# Patient Record
Sex: Female | Born: 1975 | Race: White | Hispanic: No | Marital: Married | Smoking: Former smoker
Health system: Southern US, Community
[De-identification: ages and names within clinical notes are randomized; demographics above are authoritative.]

## PROBLEM LIST (undated history)

## (undated) DIAGNOSIS — Z9289 Personal history of other medical treatment: Secondary | ICD-10-CM

## (undated) DIAGNOSIS — Z98891 History of uterine scar from previous surgery: Secondary | ICD-10-CM

## (undated) DIAGNOSIS — K219 Gastro-esophageal reflux disease without esophagitis: Secondary | ICD-10-CM

## (undated) DIAGNOSIS — R011 Cardiac murmur, unspecified: Secondary | ICD-10-CM

## (undated) DIAGNOSIS — N939 Abnormal uterine and vaginal bleeding, unspecified: Secondary | ICD-10-CM

## (undated) DIAGNOSIS — E039 Hypothyroidism, unspecified: Secondary | ICD-10-CM

## (undated) DIAGNOSIS — O24419 Gestational diabetes mellitus in pregnancy, unspecified control: Secondary | ICD-10-CM

## (undated) DIAGNOSIS — E559 Vitamin D deficiency, unspecified: Secondary | ICD-10-CM

## (undated) DIAGNOSIS — L219 Seborrheic dermatitis, unspecified: Secondary | ICD-10-CM

## (undated) DIAGNOSIS — H4020X0 Unspecified primary angle-closure glaucoma, stage unspecified: Secondary | ICD-10-CM

## (undated) DIAGNOSIS — Z9889 Other specified postprocedural states: Secondary | ICD-10-CM

## (undated) DIAGNOSIS — I8393 Asymptomatic varicose veins of bilateral lower extremities: Secondary | ICD-10-CM

## (undated) DIAGNOSIS — H40002 Preglaucoma, unspecified, left eye: Secondary | ICD-10-CM

## (undated) DIAGNOSIS — E119 Type 2 diabetes mellitus without complications: Secondary | ICD-10-CM

## (undated) DIAGNOSIS — M2550 Pain in unspecified joint: Secondary | ICD-10-CM

## (undated) HISTORY — PX: DIAGNOSTIC LAPAROSCOPY: SUR761

---

## 2015-10-23 LAB — OB RESULTS CONSOLE RUBELLA ANTIBODY, IGM: RUBELLA: IMMUNE

## 2015-10-23 LAB — OB RESULTS CONSOLE HEPATITIS B SURFACE ANTIGEN: Hepatitis B Surface Ag: NEGATIVE

## 2015-10-23 LAB — OB RESULTS CONSOLE VARICELLA ZOSTER ANTIBODY, IGG: VARICELLA IGG: IMMUNE

## 2016-05-12 LAB — OB RESULTS CONSOLE GC/CHLAMYDIA
Chlamydia: NEGATIVE
Gonorrhea: NEGATIVE

## 2016-05-12 LAB — OB RESULTS CONSOLE GBS: STREP GROUP B AG: POSITIVE

## 2016-05-12 LAB — OB RESULTS CONSOLE HIV ANTIBODY (ROUTINE TESTING): HIV: NONREACTIVE

## 2016-05-27 NOTE — H&P (Signed)
Tonya Woodward is a 40 y.o. 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 No past surgical history on file. Family History: family history is not on file. Social History:  has no tobacco, alcohol, and drug history on file.     Maternal Diabetes: Yes:  Diabetes Type:  Insulin/Medication controlled Genetic Screening: Normal Maternal Ultrasounds/Referrals: Normal Fetal Ultrasounds or other Referrals:  None Maternal Substance Abuse:  No Significant Maternal Medications:  None Significant Maternal Lab Results:  None Other Comments:  None  ROS History   Wt 197  BP 102/78 p=104 Exam Physical Exam  LUngs CTA  CV RRR  abd soft NT , gravid  Prenatal labs: ABO, Rh:  O+ Antibody:  screen neg Rubella:  imm RPR:   NR HBsAg:   neg HIV:   Neg GBS:   +  Assessment/Plan: Breech  Primary LTCS on 06/07/16 Risks discussed with pt and consent signed    Tayvon Culley 05/27/2016, 9:08 PM

## 2016-06-06 ENCOUNTER — Encounter: Payer: Self-pay | Admitting: *Deleted

## 2016-06-06 ENCOUNTER — Encounter
Admission: RE | Admit: 2016-06-06 | Discharge: 2016-06-06 | Disposition: A | Discharge status: Home / Self Care | Payer: 59 | Source: Ambulatory Visit | Attending: Obstetrics and Gynecology | Admitting: Obstetrics and Gynecology

## 2016-06-06 HISTORY — DX: Gastro-esophageal reflux disease without esophagitis: K21.9

## 2016-06-06 HISTORY — DX: Cardiac murmur, unspecified: R01.1

## 2016-06-06 HISTORY — DX: Type 2 diabetes mellitus without complications: E11.9

## 2016-06-06 HISTORY — DX: Hypothyroidism, unspecified: E03.9

## 2016-06-06 LAB — CBC
HCT: 35.2 % (ref 35.0–47.0)
HEMOGLOBIN: 12.2 g/dL (ref 12.0–16.0)
MCH: 28.5 pg (ref 26.0–34.0)
MCHC: 34.6 g/dL (ref 32.0–36.0)
MCV: 82.3 fL (ref 80.0–100.0)
Platelets: 168 10*3/uL (ref 150–440)
RBC: 4.28 MIL/uL (ref 3.80–5.20)
RDW: 13.8 % (ref 11.5–14.5)
WBC: 8.2 10*3/uL (ref 3.6–11.0)

## 2016-06-06 LAB — TYPE AND SCREEN
ABO/RH(D): O POS
Antibody Screen: NEGATIVE
EXTEND SAMPLE REASON: UNDETERMINED

## 2016-06-06 LAB — RAPID HIV SCREEN (HIV 1/2 AB+AG)
HIV 1/2 ANTIBODIES: NONREACTIVE
HIV-1 P24 ANTIGEN - HIV24: NONREACTIVE

## 2016-06-06 NOTE — Patient Instructions (Signed)
  Your procedure is scheduled on:June 07, 2016 (Tuesday) Report to LABOR AND DELIVERY To find out your arrival time please call [phone] between 1PM - 3PM on  ARRIVAL TIME 7:30 AM  Remember: Instructions that are not followed completely may result in serious medical risk, up to and including death, or upon the discretion of your surgeon and anesthesiologist your surgery may need to be rescheduled.    _x___ 1. Do not eat food or drink liquids after midnight. No gum chewing or hard candies.     _x__ 2. No Alcohol for 24 hours before or after surgery.   _x__3. No Smoking for 24 prior to surgery.   ____  4. Bring all medications with you on the day of surgery if instructed.    _x___ 5. Notify your doctor if there is any change in your medical condition     (cold, fever, infections).     Do not wear jewelry, make-up, hairpins, clips or nail polish.  Do not wear lotions, powders, or perfumes. You may wear deodorant.  Do not shave 48 hours prior to surgery. Men may shave face and neck.  Do not bring valuables to the hospital.    [facility] is not responsible for any belongings or valuables.               Contacts, dentures or bridgework may not be worn into surgery.  Leave your suitcase in the car. After surgery it may be brought to your room.  For patients admitted to the hospital, discharge time is determined by your treatment team.   Patients discharged the day of surgery will not be allowed to drive home.    Please read over the following fact sheets that you were given:   [facility] Preparing for Surgery and or MRSA Information   _x___ Take these medicines the morning of surgery with A SIP OF WATER:    1. LEVOTHYROXINE  2.  3.  4.  5.  6.  ____ Fleet Enema (as directed)   _x___ Use CHG Soap or sage wipes as directed on instruction sheet   ____ Use inhalers on the day of surgery and bring to hospital day of surgery  ____ Stop metformin 2 days prior to  surgery    ____ Take 1/2 of usual insulin dose the night before surgery and none on the morning of surgery           __x__ Stop aspirin or coumadin, or plavix (NO ASPIRIN)  _x__ Stop Anti-inflammatories such as Advil, Aleve, Ibuprofen, Motrin, Naproxen,          Naprosyn, Goodies powders or aspirin products. Ok to take Tylenol.   ____ Stop supplements until after surgery.    ____ Bring C-Pap to the hospital.

## 2016-06-07 ENCOUNTER — Encounter
Admission: RE | Disposition: A | Discharge status: Home / Self Care | Payer: Self-pay | Source: Ambulatory Visit | Attending: Obstetrics and Gynecology

## 2016-06-07 ENCOUNTER — Encounter: Payer: Self-pay | Admitting: *Deleted

## 2016-06-07 ENCOUNTER — Inpatient Hospital Stay: Payer: 59 | Admitting: Anesthesiology

## 2016-06-07 ENCOUNTER — Inpatient Hospital Stay
Admission: RE | Admit: 2016-06-07 | Discharge: 2016-06-09 | DRG: 766 | Disposition: A | Discharge status: Home / Self Care | Payer: 59 | Source: Ambulatory Visit | Attending: Obstetrics and Gynecology | Admitting: Obstetrics and Gynecology

## 2016-06-07 DIAGNOSIS — Z6831 Body mass index (BMI) 31.0-31.9, adult: Secondary | ICD-10-CM

## 2016-06-07 DIAGNOSIS — O24429 Gestational diabetes mellitus in childbirth, unspecified control: Secondary | ICD-10-CM | POA: Diagnosis present

## 2016-06-07 DIAGNOSIS — O321XX0 Maternal care for breech presentation, not applicable or unspecified: Secondary | ICD-10-CM | POA: Diagnosis present

## 2016-06-07 DIAGNOSIS — O24425 Gestational diabetes mellitus in childbirth, controlled by oral hypoglycemic drugs: Secondary | ICD-10-CM | POA: Diagnosis present

## 2016-06-07 DIAGNOSIS — Z9889 Other specified postprocedural states: Secondary | ICD-10-CM

## 2016-06-07 DIAGNOSIS — O99214 Obesity complicating childbirth: Secondary | ICD-10-CM | POA: Diagnosis present

## 2016-06-07 LAB — RPR: RPR: NONREACTIVE

## 2016-06-07 LAB — GLUCOSE, CAPILLARY
GLUCOSE-CAPILLARY: 122 mg/dL — AB (ref 65–99)
Glucose-Capillary: 73 mg/dL (ref 65–99)

## 2016-06-07 SURGERY — Surgical Case
Anesthesia: Spinal

## 2016-06-07 MED ORDER — IBUPROFEN 600 MG PO TABS: 600.0000 mg | ORAL_TABLET | Freq: Four times a day (QID) | ORAL | Status: DC | PRN

## 2016-06-07 MED ORDER — SENNOSIDES-DOCUSATE SODIUM 8.6-50 MG PO TABS
2.0000 | ORAL_TABLET | ORAL | Status: DC
  Administered 2016-06-08 (×2): 2 via ORAL
  Filled 2016-06-07 (×2): qty 2

## 2016-06-07 MED ORDER — DIPHENHYDRAMINE HCL 25 MG PO CAPS: 25.0000 mg | ORAL_CAPSULE | Freq: Four times a day (QID) | ORAL | Status: DC | PRN

## 2016-06-07 MED ORDER — BUPIVACAINE HCL (PF) 0.5 % IJ SOLN
INTRAMUSCULAR | Status: AC
  Filled 2016-06-07: qty 30

## 2016-06-07 MED ORDER — BUPIVACAINE 0.25 % ON-Q PUMP DUAL CATH 400 ML: 400.0000 mL | INJECTION | Status: DC

## 2016-06-07 MED ORDER — MORPHINE SULFATE (PF) 2 MG/ML IV SOLN: 1.0000 mg | INTRAVENOUS | Status: DC | PRN

## 2016-06-07 MED ORDER — NALBUPHINE HCL 10 MG/ML IJ SOLN: 5.0000 mg | Freq: Once | INTRAMUSCULAR | Status: DC | PRN

## 2016-06-07 MED ORDER — ONDANSETRON HCL 4 MG/2ML IJ SOLN: 4.0000 mg | Freq: Once | INTRAMUSCULAR | Status: DC | PRN

## 2016-06-07 MED ORDER — CEFAZOLIN SODIUM-DEXTROSE 2-4 GM/100ML-% IV SOLN
2.0000 g | INTRAVENOUS | Status: AC
  Administered 2016-06-07: 2 g via INTRAVENOUS
  Filled 2016-06-07: qty 100

## 2016-06-07 MED ORDER — ZOLPIDEM TARTRATE 5 MG PO TABS: 5.0000 mg | ORAL_TABLET | Freq: Every evening | ORAL | Status: DC | PRN

## 2016-06-07 MED ORDER — OXYCODONE HCL 5 MG PO TABS
10.0000 mg | ORAL_TABLET | ORAL | Status: DC | PRN
  Administered 2016-06-08 – 2016-06-09 (×6): 10 mg via ORAL
  Filled 2016-06-07 (×6): qty 2

## 2016-06-07 MED ORDER — NALOXONE HCL 0.4 MG/ML IJ SOLN: 0.4000 mg | INTRAMUSCULAR | Status: DC | PRN

## 2016-06-07 MED ORDER — DIPHENHYDRAMINE HCL 50 MG/ML IJ SOLN: 12.5000 mg | INTRAMUSCULAR | Status: DC | PRN

## 2016-06-07 MED ORDER — BUPIVACAINE 0.25 % ON-Q PUMP DUAL CATH 400 ML
INJECTION | Status: AC
  Filled 2016-06-07: qty 400

## 2016-06-07 MED ORDER — ONDANSETRON HCL 4 MG/2ML IJ SOLN: 4.0000 mg | Freq: Three times a day (TID) | INTRAMUSCULAR | Status: DC | PRN

## 2016-06-07 MED ORDER — SIMETHICONE 80 MG PO CHEW
80.0000 mg | CHEWABLE_TABLET | ORAL | Status: DC
  Administered 2016-06-08: 80 mg via ORAL
  Filled 2016-06-07: qty 1

## 2016-06-07 MED ORDER — SODIUM CHLORIDE 0.9% FLUSH: 3.0000 mL | INTRAVENOUS | Status: DC | PRN

## 2016-06-07 MED ORDER — DIPHENHYDRAMINE HCL 25 MG PO CAPS: 25.0000 mg | ORAL_CAPSULE | ORAL | Status: DC | PRN

## 2016-06-07 MED ORDER — FENTANYL CITRATE (PF) 100 MCG/2ML IJ SOLN: 25.0000 ug | INTRAMUSCULAR | Status: DC | PRN

## 2016-06-07 MED ORDER — COCONUT OIL OIL
1.0000 "application " | TOPICAL_OIL | Status: DC | PRN
  Filled 2016-06-07: qty 120

## 2016-06-07 MED ORDER — SCOPOLAMINE 1 MG/3DAYS TD PT72: 1.0000 | MEDICATED_PATCH | Freq: Once | TRANSDERMAL | Status: DC

## 2016-06-07 MED ORDER — DIBUCAINE 1 % RE OINT
1.0000 | TOPICAL_OINTMENT | RECTAL | Status: DC | PRN
Start: 2016-06-07 — End: 2016-06-09

## 2016-06-07 MED ORDER — WITCH HAZEL-GLYCERIN EX PADS
1.0000 | MEDICATED_PAD | CUTANEOUS | Status: DC | PRN
Start: 2016-06-07 — End: 2016-06-09

## 2016-06-07 MED ORDER — ONDANSETRON HCL 4 MG/2ML IJ SOLN
INTRAMUSCULAR | Status: DC | PRN
  Administered 2016-06-07: 4 mg via INTRAVENOUS

## 2016-06-07 MED ORDER — LACTATED RINGERS IV SOLN
Freq: Once | INTRAVENOUS | Status: AC
  Administered 2016-06-07: 09:00:00 via INTRAVENOUS

## 2016-06-07 MED ORDER — BUPIVACAINE-EPINEPHRINE (PF) 0.5% -1:200000 IJ SOLN
INTRAMUSCULAR | Status: DC | PRN
  Administered 2016-06-07: 10 mL

## 2016-06-07 MED ORDER — OXYTOCIN 40 UNITS IN LACTATED RINGERS INFUSION - SIMPLE MED
INTRAVENOUS | Status: DC | PRN
  Administered 2016-06-07: 200 mL via INTRAVENOUS
  Administered 2016-06-07: 800 mL via INTRAVENOUS

## 2016-06-07 MED ORDER — SIMETHICONE 80 MG PO CHEW: 80.0000 mg | CHEWABLE_TABLET | ORAL | Status: DC | PRN

## 2016-06-07 MED ORDER — LACTATED RINGERS IV SOLN
INTRAVENOUS | Status: DC
  Administered 2016-06-08: 03:00:00 via INTRAVENOUS

## 2016-06-07 MED ORDER — MEPERIDINE HCL 25 MG/ML IJ SOLN: 6.2500 mg | INTRAMUSCULAR | Status: DC | PRN

## 2016-06-07 MED ORDER — IBUPROFEN 600 MG PO TABS
600.0000 mg | ORAL_TABLET | Freq: Four times a day (QID) | ORAL | Status: DC
  Administered 2016-06-07 – 2016-06-09 (×8): 600 mg via ORAL
  Filled 2016-06-07 (×8): qty 1

## 2016-06-07 MED ORDER — MENTHOL 3 MG MT LOZG
1.0000 | LOZENGE | OROMUCOSAL | Status: DC | PRN
  Filled 2016-06-07: qty 9

## 2016-06-07 MED ORDER — TETANUS-DIPHTH-ACELL PERTUSSIS 5-2.5-18.5 LF-MCG/0.5 IM SUSP: 0.5000 mL | Freq: Once | INTRAMUSCULAR | Status: DC

## 2016-06-07 MED ORDER — NALBUPHINE HCL 10 MG/ML IJ SOLN: 5.0000 mg | INTRAMUSCULAR | Status: DC | PRN

## 2016-06-07 MED ORDER — NALOXONE HCL 2 MG/2ML IJ SOSY
1.0000 ug/kg/h | PREFILLED_SYRINGE | INTRAMUSCULAR | Status: DC | PRN
  Filled 2016-06-07: qty 2

## 2016-06-07 MED ORDER — LACTATED RINGERS IV SOLN
INTRAVENOUS | Status: DC
  Administered 2016-06-07: 09:00:00 via INTRAVENOUS

## 2016-06-07 MED ORDER — SIMETHICONE 80 MG PO CHEW
80.0000 mg | CHEWABLE_TABLET | Freq: Three times a day (TID) | ORAL | Status: DC
  Administered 2016-06-07 – 2016-06-09 (×5): 80 mg via ORAL
  Filled 2016-06-07 (×5): qty 1

## 2016-06-07 MED ORDER — OXYTOCIN 40 UNITS IN LACTATED RINGERS INFUSION - SIMPLE MED
2.5000 [IU]/h | INTRAVENOUS | Status: DC
Start: 2016-06-07 — End: 2016-06-08

## 2016-06-07 MED ORDER — OXYTOCIN 40 UNITS IN LACTATED RINGERS INFUSION - SIMPLE MED: INTRAVENOUS | Status: DC | PRN

## 2016-06-07 MED ORDER — LEVOTHYROXINE SODIUM 112 MCG PO TABS
112.0000 ug | ORAL_TABLET | Freq: Every day | ORAL | Status: DC
  Administered 2016-06-08: 112 ug via ORAL
  Filled 2016-06-07: qty 1

## 2016-06-07 MED ORDER — SOD CITRATE-CITRIC ACID 500-334 MG/5ML PO SOLN
30.0000 mL | ORAL | Status: AC
  Administered 2016-06-07: 30 mL via ORAL
  Filled 2016-06-07: qty 30

## 2016-06-07 MED ORDER — PANTOPRAZOLE SODIUM 40 MG PO TBEC
40.0000 mg | DELAYED_RELEASE_TABLET | Freq: Every day | ORAL | Status: DC | PRN
  Filled 2016-06-07: qty 1

## 2016-06-07 MED ORDER — OXYTOCIN 40 UNITS IN LACTATED RINGERS INFUSION - SIMPLE MED
INTRAVENOUS | Status: AC
  Administered 2016-06-07: 40 [IU]
  Filled 2016-06-07: qty 1000

## 2016-06-07 MED ORDER — ACETAMINOPHEN 325 MG PO TABS: 650.0000 mg | ORAL_TABLET | ORAL | Status: DC | PRN

## 2016-06-07 MED ORDER — PHENYLEPHRINE HCL 10 MG/ML IJ SOLN
INTRAMUSCULAR | Status: DC | PRN
  Administered 2016-06-07 (×2): 100 ug via INTRAVENOUS
  Administered 2016-06-07 (×2): 200 ug via INTRAVENOUS
  Administered 2016-06-07 (×3): 100 ug via INTRAVENOUS

## 2016-06-07 MED ORDER — PRENATAL MULTIVITAMIN CH
1.0000 | ORAL_TABLET | Freq: Every day | ORAL | Status: DC
  Administered 2016-06-07 – 2016-06-09 (×3): 1 via ORAL
  Filled 2016-06-07 (×4): qty 1

## 2016-06-07 SURGICAL SUPPLY — 24 items
BARRIER ADHS 3X4 INTERCEED (GAUZE/BANDAGES/DRESSINGS) ×3 IMPLANT
CANISTER SUCT 3000ML (MISCELLANEOUS) ×3 IMPLANT
CATH KIT ON-Q SILVERSOAK 5IN (CATHETERS) ×6 IMPLANT
CHLORAPREP W/TINT 26ML (MISCELLANEOUS) ×3 IMPLANT
DRSG TELFA 3X8 NADH (GAUZE/BANDAGES/DRESSINGS) ×3 IMPLANT
ELECT CAUTERY BLADE 6.4 (BLADE) ×3 IMPLANT
ELECT REM PT RETURN 9FT ADLT (ELECTROSURGICAL) ×3
ELECTRODE REM PT RTRN 9FT ADLT (ELECTROSURGICAL) ×1 IMPLANT
GAUZE SPONGE 4X4 12PLY STRL (GAUZE/BANDAGES/DRESSINGS) ×3 IMPLANT
GLOVE BIO SURGEON STRL SZ8 (GLOVE) ×3 IMPLANT
GOWN STRL REUS W/ TWL LRG LVL3 (GOWN DISPOSABLE) ×2 IMPLANT
GOWN STRL REUS W/ TWL XL LVL3 (GOWN DISPOSABLE) ×1 IMPLANT
GOWN STRL REUS W/TWL LRG LVL3 (GOWN DISPOSABLE) ×4
GOWN STRL REUS W/TWL XL LVL3 (GOWN DISPOSABLE) ×2
NS IRRIG 1000ML POUR BTL (IV SOLUTION) ×6 IMPLANT
PACK C SECTION AR (MISCELLANEOUS) ×3 IMPLANT
PAD OB MATERNITY 4.3X12.25 (PERSONAL CARE ITEMS) ×3 IMPLANT
PAD PREP 24X41 OB/GYN DISP (PERSONAL CARE ITEMS) ×3 IMPLANT
SPONGE LAP 4X18 5PK (MISCELLANEOUS) ×3 IMPLANT
STAPLER INSORB 30 2030 C-SECTI (MISCELLANEOUS) ×3 IMPLANT
STRAP SAFETY BODY (MISCELLANEOUS) ×3 IMPLANT
SUT CHROMIC 1 CTX 36 (SUTURE) ×9 IMPLANT
SUT PLAIN GUT 0 (SUTURE) ×6 IMPLANT
SUT VIC AB 0 CT1 36 (SUTURE) ×6 IMPLANT

## 2016-06-07 NOTE — Transfer of Care (Signed)
Immediate Anesthesia Transfer of Care Note  Patient: Tonya BearsMartha R Hightower  Procedure(s) Performed: Procedure(s): CESAREAN SECTION (N/A)  Patient Location: PACU  Anesthesia Type:Spinal  Level of Consciousness: awake  Airway & Oxygen Therapy: Patient Spontanous Breathing  Post-op Assessment: Report given to RN and Post -op Vital signs reviewed and stable  Post vital signs: Reviewed and stable  Last Vitals:  Vitals:   06/07/16 0840 06/07/16 1002  BP:  127/75  Pulse:  83  Resp: 16   Temp: 37.1 C     Last Pain:  Vitals:   06/07/16 0840  TempSrc: Oral  PainSc:          Complications: No apparent anesthesia complications

## 2016-06-07 NOTE — Progress Notes (Signed)
Pt's capillary glucose is 122 at this time; pt "just had my dinner 2 hours ago"; this glucose would be considered a 2-hour post prandial glucose (the MD order reads check CBG every 8 hours for the first 24 hours)

## 2016-06-07 NOTE — Progress Notes (Signed)
Ready for primary LTCS for breech presentation . Verified again today . All questions answered . NPO . Declines BTL  On q pump requested .

## 2016-06-07 NOTE — Brief Op Note (Signed)
06/07/2016  11:09 AM  Delivery 1038 on 06/07/16  PATIENT:  Tonya Woodward  40 y.o. female  PRE-OPERATIVE DIAGNOSIS:  Breech presentation  POST-OPERATIVE DIAGNOSIS:  Breech Homero FellersFrank PROCEDURE:  Ow transverse cesarean section On q pump placement SURGEON:  Surgeon(s) and Role:    Suzy Bouchard* Thomas J Schermerhorn, MD - Primary  PHYSICIAN ASSISTANT: Sigmon ,M   CNM  ASSISTANTS: none   ANESTHESIA:   spinal  EBL:  Total I/O In: 800 [I.V.:800] Out: 500 [Blood:500]  BLOOD ADMINISTERED:none  DRAINS: Urinary Catheter (Foley)   LOCAL MEDICATIONS USED:  MARCAINE     SPECIMEN:  No Specimen  DISPOSITION OF SPECIMEN:  N/A  COUNTS:  YES  TOURNIQUET:  * No tourniquets in log *  DICTATION: .Other Dictation: Dictation Number verbal  PLAN OF CARE: Admit to inpatient   PATIENT DISPOSITION:  PACU - hemodynamically stable.   Delay start of Pharmacological VTE agent (>24hrs) due to surgical blood loss or risk of bleeding: not applicable

## 2016-06-07 NOTE — Anesthesia Procedure Notes (Signed)
Spinal  Patient location during procedure: OB Start time: 06/07/2016 10:17 AM End time: 06/07/2016 10:22 AM Staffing Anesthesiologist: Yves DillARROLL, Mikyla Schachter Performed: anesthesiologist  Preanesthetic Checklist Completed: patient identified, site marked, surgical consent, pre-op evaluation, timeout performed, IV checked, risks and benefits discussed and monitors and equipment checked Spinal Block Patient position: sitting Prep: Betadine and site prepped and draped Patient monitoring: heart rate, cardiac monitor, continuous pulse ox and blood pressure Approach: right paramedian Location: L3-4 Injection technique: single-shot Needle Needle type: Whitacre  Needle gauge: 25 G Needle length: 9 cm Assessment Sensory level: T4 Additional Notes Time out called.  Patient placed in sitting position.  Back prepped and draped in sterile fashion.  A skin wheal was made lateral to the L3-L4 interspace and a paramedian approach was used after unsuucessful pass  In the median position after adequate skin wheal with 1% Lidocaine plain.  Easy X1 in the paramedian position with the return of clear, colorless CSF in all 4 quads.  Level obtained was T4T4 by cold and pin prick.  13 mg of spinal Marcaine + 0.2 mg of Astromorph.  No blood or paresthesias and patient tolerated the procedure well.

## 2016-06-07 NOTE — Anesthesia Preprocedure Evaluation (Signed)
Anesthesia Evaluation  Patient identified by MRN, date of birth, ID band Patient awake    Reviewed: Allergy & Precautions, NPO status , Patient's Chart, lab work & pertinent test results  Airway Mallampati: II       Dental no notable dental hx.    Pulmonary neg pulmonary ROS, former smoker,    Pulmonary exam normal        Cardiovascular negative cardio ROS Normal cardiovascular exam+ Valvular Problems/Murmurs      Neuro/Psych negative neurological ROS  negative psych ROS   GI/Hepatic GERD  Medicated and Controlled,  Endo/Other  diabetes, Well Controlled, Type 2, Oral Hypoglycemic AgentsHypothyroidism   Renal/GU   negative genitourinary   Musculoskeletal negative musculoskeletal ROS (+)   Abdominal Normal abdominal exam  (+)   Peds negative pediatric ROS (+)  Hematology negative hematology ROS (+)   Anesthesia Other Findings   Reproductive/Obstetrics                             Anesthesia Physical Anesthesia Plan  ASA: II  Anesthesia Plan: Spinal   Post-op Pain Management:    Induction: Intravenous  Airway Management Planned: Nasal Cannula  Additional Equipment:   Intra-op Plan:   Post-operative Plan:   Informed Consent: I have reviewed the patients History and Physical, chart, labs and discussed the procedure including the risks, benefits and alternatives for the proposed anesthesia with the patient or authorized representative who has indicated his/her understanding and acceptance.   Dental advisory given  Plan Discussed with: CRNA and Surgeon  Anesthesia Plan Comments:         Anesthesia Quick Evaluation

## 2016-06-07 NOTE — Op Note (Signed)
NAME:  Tonya Woodward, Tonya Woodward            ACCOUNT NO.:  [id number]  MEDICAL RECORD NO.:  [id number]  LOCATION:  LDR6                         FACILITY:  [facility]  PHYSICIAN:  Jennell Cornerhomas Shaquavia Whisonant, MDDATE OF BIRTH:  04-16-1976  DATE OF PROCEDURE: DATE OF DISCHARGE:                              OPERATIVE REPORT   PREOPERATIVE DIAGNOSIS: 1. 39+ 6 weeks estimated gestational age. 2. Breech presentation.  POSTOPERATIVE DIAGNOSIS: 1. 39+ 6 weeks estimated gestational age. 2. Homero FellersFrank breech presentation.  PROCEDURE: 1. Primary low transverse cesarean section. 2. Placement of ON-Q pump.  ANESTHESIA:  Spinal.  SURGEON:  Jennell Cornerhomas Merrill Deanda, MD.  FIRST ASSISTANT:  Sigmen, certified nurse midwife.  INDICATIONS:  A 40 year old female at 39+ 6 weeks estimated gestational age.  The patient is noted to have her fetus in the breech presentation and elects for primary low transverse cesarean section.  The patient declines bilateral tubal ligation.  DESCRIPTION OF PROCEDURE:  After adequate spinal anesthesia, the patient was placed in the dorsal supine position.  Hip rolled on to the right side.  The patient's abdomen was prepped and draped in normal sterile fashion.  Time-out was performed.  The patient did receive 2 g IV Ancef prior to commencement of the case.  A Pfannenstiel incision was made 2 fingerbreadths above the symphysis pubis.  Sharp dissection was used to identify the fascia.  Fascia was opened in midline and opened in a transverse fashion.  The superior aspect of the fascia was grasped with Kocher clamps and the recti muscles were dissected free.  Inferior aspect of the fascia was grasped with Kocher clamps and pyramidalis muscles dissected free.  Entry into the peritoneal cavity was accomplished sharply.  The vesicouterine peritoneal fold was identified and a bladder flap was created.  The bladder was reflected inferiorly. Low-transverse uterine incision was made upon entry into the  endometrial cavity.  Clear fluid resulted.  The incision was extended with blunt transverse traction.  Fetal buttocks were identified and delivered through the incision followed by the legs.  They were noted to be in a frank breech position.  Legs were delivered with a sweeping motion medial to lateral.  _wound towel_________ was placed over the buttocks to allow for traction as the arms were delivered also with the sweeping motion lateral to medial and head was delivered without difficulty.  Vigorous female was then dried for the next 60 seconds with a delayed cord clamping.  Cord was clamped and infant was passed to the neonatologist who assigned Apgar scores of 8 and 9.  Placenta was manually delivered. The uterus was exteriorized.  Endometrial cavity was wiped clean with laparotomy tape and the cervix was opened with a ring forceps.  Uterine incision was closed with a running 1 chromic suture.  Good hemostasis was noted.  Fallopian tubes and ovaries appeared normal.  Posterior cul- de-sac was irrigated and suctioned.  The uterus was then placed back into the abdominal cavity.  The pericolic gutters were wiped clean with laparotomy tip and uterine incision again appeared hemostatic. Interceed was placed over the uterine incision in a T-shaped fashion. The superior aspect of the fascia was grasped with Kocher clamps and the ON-Q pump catheters were advanced from infraumbilical  position to a subfascial position.  The fascia was then closed over top of these catheters with 0 Vicryl suture in a running nonlocking fashion.  Good approximation of the tissues noted.  Subcutaneous tissues were irrigated and bovied for hemostasis and the skin was reapproximated with INSORB absorbable staples.  Good cosmetic effect was noted.  Each catheter ON-Q catheter was then secured at the skin level with LiquiBand and Steri- Strips and Tegaderm was placed over top of these.  Each catheter was then loaded  with 5 mL of 0.5% Marcaine.  ESTIMATED BLOOD LOSS:  500 mL.  INTRAOPERATIVE FLUIDS:  1000 mL.  There were no complications.  The patient tolerated the procedure well.          ______________________________ Jennell Cornerhomas Pearse Shiffler, MD     TS/MEDQ  D:  06/07/2016  T:  06/07/2016  Job:  [id number]

## 2016-06-08 LAB — GLUCOSE, CAPILLARY
GLUCOSE-CAPILLARY: 114 mg/dL — AB (ref 65–99)
Glucose-Capillary: 99 mg/dL (ref 65–99)
Glucose-Capillary: 137 mg/dL — ABNORMAL HIGH (ref 65–99)

## 2016-06-08 LAB — CBC
HEMATOCRIT: 29.2 % — AB (ref 35.0–47.0)
Hemoglobin: 9.9 g/dL — ABNORMAL LOW (ref 12.0–16.0)
MCH: 28.1 pg (ref 26.0–34.0)
MCHC: 34 g/dL (ref 32.0–36.0)
MCV: 82.7 fL (ref 80.0–100.0)
Platelets: 146 10*3/uL — ABNORMAL LOW (ref 150–440)
RBC: 3.53 MIL/uL — ABNORMAL LOW (ref 3.80–5.20)
RDW: 13.6 % (ref 11.5–14.5)
WBC: 11.1 10*3/uL — ABNORMAL HIGH (ref 3.6–11.0)

## 2016-06-08 NOTE — Anesthesia Postprocedure Evaluation (Signed)
Anesthesia Post Note  Patient: Tonya BearsMartha R Woodward  Procedure(s) Performed: Procedure(s) (LRB): CESAREAN SECTION (N/A)  Patient location during evaluation: Mother Baby Anesthesia Type: Spinal Level of consciousness: awake and alert Pain management: pain level controlled Vital Signs Assessment: post-procedure vital signs reviewed and stable Respiratory status: spontaneous breathing Cardiovascular status: blood pressure returned to baseline Postop Assessment: no headache, no backache, spinal receding and patient able to bend at knees Anesthetic complications: no    Last Vitals:  Vitals:   06/08/16 0327 06/08/16 0722  BP: (!) 97/55 107/61  Pulse: 73 79  Resp: 18 20  Temp: 36.9 C 36.7 C    Last Pain:  Vitals:   06/08/16 0722  TempSrc: Oral  PainSc:                  Jules SchickLogan,  Evangelene Vora P

## 2016-06-08 NOTE — Lactation Note (Signed)
This note was copied from a baby's chart. Lactation Consultation Note  Patient Name: Tonya Woodward QIONG'EToday's Date: 06/08/2016 Reason for consult: Follow-up assessment;Breast/nipple pain;Difficult latch   Maternal Data  All day, Mom denied problems, pain or needing LC help, but now has bruised nipples (on face of each). Mom needed a little help to get nipple up deeper into shield chamber (20mm). The application of shield made mom yelp a bit from pain. Once baby started sucking, pain resolved. Mom does need to massage breast and stim baby to avoid her sleeping at breast. Baby does have a fairly tight frenulum I have been watching. If she can latch and transfer milk with out problems, she must be adapting well. If problems increase or persist, I would encourage more in depth look at frenulum as possible cause. I mentioned this to parents as well. Probably the most likely problem is that mom said she has had visitors all day and not focused on quality of feedings. Sh eis also quite exhausted. I encourged her to get a 1-2 hour nap before night time as babies tend to feed a lot at night. Otherwise , several voids and stools already.   Feeding Feeding Type: Breast Fed  LATCH Score/Interventions Latch: Repeated attempts needed to sustain latch, nipple held in mouth throughout feeding, stimulation needed to elicit sucking reflex. (needs nipple shield to maintain latch) Intervention(s): Assist with latch;Breast compression  Audible Swallowing: A few with stimulation Intervention(s): Hand expression  Type of Nipple: Everted at rest and after stimulation  Comfort (Breast/Nipple): Filling, red/small blisters or bruises, mild/mod discomfort  Problem noted: Cracked, bleeding, blisters, bruises (bruised nipples; pain with shield application) Interventions  (Cracked/bleeding/bruising/blister): Expressed breast milk to nipple (comfort gels. deeper latch)  Hold (Positioning): No assistance needed to  correctly position infant at breast.  LATCH Score: 7  Lactation Tools Discussed/Used     Consult Status Consult Status: Follow-up Date: 06/09/16 Follow-up type: In-patient    Sunday CornSandra Clark Lynard Postlewait 06/08/2016, 6:00 PM

## 2016-06-08 NOTE — Progress Notes (Signed)
Subjective: Postpartum Day 1: Cesarean Delivery Patient reports no problems voiding.    Objective: Vital signs in last 24 hours: Temp:  [96.2 F (35.7 C)-98.7 F (37.1 C)] 98.1 F (36.7 C) (08/16 0722) Pulse Rate:  [59-87] 79 (08/16 0722) Resp:  [16-20] 20 (08/16 0722) BP: (94-127)/(54-80) 107/61 (08/16 0722) SpO2:  [98 %-100 %] 98 % (08/16 0327) Weight:  [196 lb (88.9 kg)] 196 lb (88.9 kg) (08/15 1238)  Physical Exam:  General: alert and cooperative Lochia: appropriate Uterine Fundus: firm Incision: no significant drainage DVT Evaluation: No evidence of DVT seen on physical exam. cv RRR   Recent Labs  06/06/16 0904 06/08/16 0441  HGB 12.2 9.9*  HCT 35.2 29.2*    Assessment/Plan: Status post Cesarean section. Doing well postoperatively.  Continue current care.  Garrie Woodin 06/08/2016, 8:30 AM

## 2016-06-08 NOTE — Anesthesia Post-op Follow-up Note (Signed)
  Anesthesia Pain Follow-up Note  Patient: Tonya Woodward  Day #: 1  Date of Follow-up: 06/08/2016 Time: 7:26 AM  Last Vitals:  Vitals:   06/08/16 0327 06/08/16 0722  BP: (!) 97/55 107/61  Pulse: 73 79  Resp: 18 20  Temp: 36.9 C 36.7 C    Level of Consciousness: alert  Pain: mild   Side Effects:None  Catheter Site Exam:clean     Plan: D/C from anesthesia care  Jules SchickLogan,  [name]

## 2016-06-09 MED ORDER — OXYCODONE-ACETAMINOPHEN 5-325 MG PO TABS: 1.0000 | ORAL_TABLET | Freq: Four times a day (QID) | ORAL | 0 refills | Status: DC | PRN

## 2016-06-09 MED ORDER — FERROUS SULFATE 325 (65 FE) MG PO TABS: 325.0000 mg | ORAL_TABLET | Freq: Two times a day (BID) | ORAL | 3 refills | Status: DC

## 2016-06-09 MED ORDER — IBUPROFEN 800 MG PO TABS: 800.0000 mg | ORAL_TABLET | Freq: Three times a day (TID) | ORAL | 1 refills | Status: AC | PRN

## 2016-06-09 MED ORDER — DOCUSATE SODIUM 100 MG PO CAPS: 100.0000 mg | ORAL_CAPSULE | Freq: Every day | ORAL | 3 refills | Status: DC | PRN

## 2016-06-09 MED ORDER — ASCORBIC ACID 250 MG PO TABS: 250.0000 mg | ORAL_TABLET | Freq: Two times a day (BID) | ORAL | 3 refills | Status: AC

## 2016-06-09 NOTE — Progress Notes (Signed)
Patient understands all discharge instructions and the need to attend follow up appointments. Patient discharge via wheelchair with auxillary. 

## 2016-06-09 NOTE — Discharge Summary (Signed)
Obstetric Discharge Summary Reason for Admission: cesarean section Prenatal Procedures: ultrasound and NST for gdma2 Intrapartum Procedures: spontaneous vaginal delivery Postpartum Procedures: none Complications-Operative and Postpartum: none Hemoglobin  Date Value Ref Range Status  06/08/2016 9.9 (L) 12.0 - 16.0 g/dL Final   HCT  Date Value Ref Range Status  06/08/2016 29.2 (L) 35.0 - 47.0 % Final    Physical Exam:  General: alert, cooperative and appears stated age 66Lochia: appropriate Uterine Fundus: firm Incision: healing well, no significant drainage, no dehiscence, no significant erythema DVT Evaluation: No evidence of DVT seen on physical exam.  Discharge Diagnoses: Term Pregnancy-delivered  Discharge Information: Date: 06/09/2016 Activity: pelvic rest Diet: routine Medications: Ibuprofen, Colace, Iron and Percocet Condition: stable Instructions: refer to practice specific booklet Discharge to: home   Newborn Data: Live born female  Birth Weight: 9 lb 4.5 oz (4210 g) APGAR: 8, 9  Home with mother.  Christeen DouglasBEASLEY, Curby Carswell 06/09/2016, 9:05 AM

## 2016-06-09 NOTE — Lactation Note (Signed)
This note was copied from a baby's chart. Lactation Consultation Note  Patient Name: Girl Don BroachMartha Winegardner ZOXWR'UToday's Date: 06/09/2016     Mother reports that feeding has been going well and she will call us if she needs help. She did ask for information for contacting us in the future. [facility]                        Lactation Tools Discussed/Used     Consult Status      Trudee GripCarolyn P Slyvia Lartigue 06/09/2016, 9:37 AM

## 2021-03-03 ENCOUNTER — Other Ambulatory Visit: Payer: Self-pay

## 2021-03-03 ENCOUNTER — Ambulatory Visit
Admission: EM | Admit: 2021-03-03 | Discharge: 2021-03-03 | Disposition: A | Discharge status: Home / Self Care | Payer: 59 | Attending: Sports Medicine | Admitting: Sports Medicine

## 2021-03-03 DIAGNOSIS — Z20822 Contact with and (suspected) exposure to covid-19: Secondary | ICD-10-CM | POA: Diagnosis present

## 2021-03-03 DIAGNOSIS — U071 COVID-19: Secondary | ICD-10-CM

## 2021-03-03 LAB — RESP PANEL BY RT-PCR (FLU A&B, COVID) ARPGX2
Influenza A by PCR: NEGATIVE
Influenza B by PCR: NEGATIVE
SARS Coronavirus 2 by RT PCR: POSITIVE — AB

## 2021-03-03 MED ORDER — PROMETHAZINE-DM 6.25-15 MG/5ML PO SYRP: 5.0000 mL | ORAL_SOLUTION | Freq: Four times a day (QID) | ORAL | 0 refills | Status: DC | PRN

## 2021-03-03 MED ORDER — AEROCHAMBER MV MISC: 2 refills | Status: AC

## 2021-03-03 MED ORDER — BENZONATATE 100 MG PO CAPS: 200.0000 mg | ORAL_CAPSULE | Freq: Three times a day (TID) | ORAL | 0 refills | Status: DC

## 2021-03-03 MED ORDER — ALBUTEROL SULFATE HFA 108 (90 BASE) MCG/ACT IN AERS: 2.0000 | INHALATION_SPRAY | RESPIRATORY_TRACT | 0 refills | Status: AC | PRN

## 2021-03-03 NOTE — ED Triage Notes (Addendum)
Patient states that she was exposed to Covid and is currently having a fever and nasal congestion.

## 2021-03-03 NOTE — ED Provider Notes (Signed)
[facility]    CSN: [id number] Arrival date & time: 03/03/21  1047      History   Chief Complaint Chief Complaint  Patient presents with  . Nasal Congestion    HPI Tonya Woodward is a 45 y.o. female.   HPI   45 year old female here for evaluation of nasal congestion and fever.  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  She states that her symptoms began yesterday with nasal congestion and then she developed a fever today along with a runny nose, sore throat, ear pressure, mild cough and mild shortness of breath.  Patient is also had a headache and body aches.  Patient denies significant nasal discharge, wheezing, or GI complaints.  She has been vaccinated and boosted against COVID.  Patient reports that her exposure was through a coworker at a function.  Past Medical History:  Diagnosis Date  . Diabetes mellitus without complication (HCC)    Gestational  . GERD (gastroesophageal reflux disease)   . Heart murmur   . Hypothyroidism     Patient Active Problem List   Diagnosis Date Noted  . Post-operative state 06/07/2016    Past Surgical History:  Procedure Laterality Date  . CESAREAN SECTION N/A 06/07/2016   Procedure: CESAREAN SECTION;  Surgeon: Suzy Bouchard, MD;  Location: [facility] ORS;  Service: Obstetrics;  Laterality: N/A;  . DIAGNOSTIC LAPAROSCOPY      OB History    Gravida  2   Para  1   Term  1   Preterm  0   AB  1   Living  1     SAB  1   IAB  0   Ectopic  0   Multiple  0   Live Births  1            Home Medications    Prior to Admission medications   Medication Sig Start Date End Date Taking? Authorizing Provider  albuterol (VENTOLIN HFA) 108 (90 Base) MCG/ACT inhaler Inhale 2 puffs into the lungs every 4 (four) hours as needed. 03/03/21  Yes Becky Augusta, NP  benzonatate (TESSALON) 100 MG capsule Take 2 capsules (200 mg total) by mouth every  8 (eight) hours. 03/03/21  Yes Becky Augusta, NP  Multiple Vitamins-Minerals (MULTIVITAMIN WITH MINERALS) tablet Take 1 tablet by mouth daily.   Yes [provider]  promethazine-dextromethorphan (PROMETHAZINE-DM) 6.25-15 MG/5ML syrup Take 5 mLs by mouth 4 (four) times daily as needed. 03/03/21  Yes Becky Augusta, NP  Spacer/Aero-Holding Chambers (AEROCHAMBER MV) inhaler Use as instructed 03/03/21  Yes Becky Augusta, NP  SYNTHROID 100 MCG tablet Take 100 mcg by mouth daily. 11/16/20  Yes [provider]  Vitamin D, Ergocalciferol, (DRISDOL) 1.25 MG (50000 UNIT) CAPS capsule Take 1 capsule by mouth once a week. 02/05/21  Yes [provider]  Prenatal Multivit-Min-Fe-FA (PRENATAL VITAMINS) 0.8 MG tablet Take 1 tablet by mouth daily.    [provider]  ferrous sulfate 325 (65 FE) MG tablet Take 1 tablet (325 mg total) by mouth 2 (two) times daily with a meal. For anemia, take with Vitamin C 06/09/16 03/03/21  Christeen Douglas, MD  omeprazole (PRILOSEC) 40 MG capsule Take 40 mg by mouth as needed.  03/03/21  [provider]    Family History History reviewed. No pertinent family history.  Social History Social History   Tobacco Use  . Smoking status: Former Smoker  Packs/day: 0.25    Types: Cigarettes  . Smokeless tobacco: Never Used  Substance Use Topics  . Alcohol use: No  . Drug use: No     Allergies   Patient has no known allergies.   Review of Systems Review of Systems  Constitutional: Positive for fever. Negative for activity change and appetite change.  HENT: Positive for congestion, rhinorrhea and sore throat. Negative for ear pain.   Respiratory: Positive for cough and shortness of breath. Negative for wheezing.   Gastrointestinal: Negative for diarrhea and nausea.  Musculoskeletal: Positive for arthralgias and myalgias.  Skin: Negative for rash.  Neurological: Positive for headaches.  Hematological: Negative.    Psychiatric/Behavioral: Negative.      Physical Exam Triage Vital Signs ED Triage Vitals  Enc Vitals Group     BP 03/03/21 1120 118/70     Pulse Rate 03/03/21 1120 (!) 108     Resp 03/03/21 1120 16     Temp 03/03/21 1120 100 F (37.8 C)     Temp Source 03/03/21 1120 Oral     SpO2 03/03/21 1120 100 %     Weight 03/03/21 1124 150 lb (68 kg)     Height 03/03/21 1124 5\' 5"  (1.651 m)     Head Circumference --      Peak Flow --      Pain Score 03/03/21 1119 4     Pain Loc --      Pain Edu? --      Excl. in GC? --    No data found.  Updated Vital Signs BP 118/70 (BP Location: Left Arm)   Pulse (!) 108   Temp 100 F (37.8 C) (Oral)   Resp 16   Ht 5\' 5"  (1.651 m)   Wt 150 lb (68 kg)   LMP 02/19/2021   SpO2 100%   Breastfeeding No   BMI 24.96 kg/m   Visual Acuity Right Eye Distance:   Left Eye Distance:   Bilateral Distance:    Right Eye Near:   Left Eye Near:    Bilateral Near:     Physical Exam Vitals and nursing note reviewed.  Constitutional:      General: She is not in acute distress.    Appearance: Normal appearance. She is normal weight.  HENT:     Head: Normocephalic and atraumatic.     Right Ear: Tympanic membrane, ear canal and external ear normal.     Left Ear: Tympanic membrane, ear canal and external ear normal.     Nose: Congestion and rhinorrhea present.     Mouth/Throat:     Mouth: Mucous membranes are moist.     Pharynx: Oropharynx is clear. No posterior oropharyngeal erythema.  Cardiovascular:     Rate and Rhythm: Normal rate and regular rhythm.     Pulses: Normal pulses.     Heart sounds: Normal heart sounds. No murmur heard. No gallop.   Pulmonary:     Effort: Pulmonary effort is normal.     Breath sounds: Normal breath sounds. No wheezing, rhonchi or rales.  Musculoskeletal:     Cervical back: Normal range of motion and neck supple.  Lymphadenopathy:     Cervical: Cervical adenopathy present.  Skin:    General: Skin is warm and  dry.     Capillary Refill: Capillary refill takes less than 2 seconds.     Findings: No erythema or rash.  Neurological:     General: No focal deficit present.  Mental Status: She is alert and oriented to person, place, and time.  Psychiatric:        Mood and Affect: Mood normal.        Behavior: Behavior normal.        Thought Content: Thought content normal.        Judgment: Judgment normal.      UC Treatments / Results  Labs (all labs ordered are listed, but only abnormal results are displayed) Labs Reviewed  RESP PANEL BY RT-PCR (FLU A&B, COVID) ARPGX2 - Abnormal; Notable for the following components:      Result Value   SARS Coronavirus 2 by RT PCR POSITIVE (*)    All other components within normal limits    EKG   Radiology No results found.  Procedures Procedures (including critical care time)  Medications Ordered in UC Medications - No data to display  Initial Impression / Assessment and Plan / UC Course  I have reviewed the triage vital signs and the nursing notes.  Pertinent labs & imaging results that were available during my care of the patient were reviewed by me and considered in my medical decision making (see chart for details).   Patient is a very pleasant, nontoxic-appearing 45 year old female here for evaluation of COVID symptoms after suffering an exposure 5 days ago.  She reports that she developed symptoms yesterday and initially they consisted of nasal congestion, and mild runny nose.  She also endorses sore throat, ear pressure, intermittent nonproductive cough, mild shortness of breath, body aches and headache.  She came here today for testing and is febrile at 100 F.  She has had no appreciable nasal discharge, wheezing, or GI complaints.  Patient has been vaccinated and boosted.  Physical exam reveals pearly gray tympanic membranes bilaterally with a normal light reflex and clear external auditory canals.  Nasal mucosa is mildly erythematous  without edema or significant nasal discharge.  Oropharyngeal exam is benign.  Patient does have bilateral, shotty, tender anterior cervical lymphadenopathy.  Cardiopulmonary exam is benign.  We will send respiratory triplex panel.  Respiratory triplex panel is positive for COVID and negative influenza.  Will discharge patient home with albuterol inhaler and spacer to help with shortness of breath as needed, Tessalon Perles and Promethazine DM cough syrup to help with cough and congestion, and supportive care.  Work note provided.   Final Clinical Impressions(s) / UC Diagnoses   Final diagnoses:  Close exposure to COVID-19 virus  COVID-19     Discharge Instructions     You have tested positive for COVID-19 and therefore you will have to quarantine for 5 days from the start of your symptoms.  After 5 days you can break quarantine if your symptoms have improved and you have not had a fever for 24 hours without taking Tylenol or ibuprofen.  Use over-the-counter Tylenol and ibuprofen as needed for body aches and fever.  Use the Tessalon Perles during the day as needed for cough and the Promethazine DM cough syrup at nighttime as will make you drowsy.  Use the albuterol inhaler with a spacer, 2 puffs every 4-6 hours, as needed for shortness of breath or if you develop any wheezing.  If you develop any increased shortness of breath-especially at rest, you are unable to speak in full sentences, or is a late sign your lips are turning blue you need to go the ER for evaluation.     ED Prescriptions    Medication Sig Dispense Auth. Provider  albuterol (VENTOLIN HFA) 108 (90 Base) MCG/ACT inhaler Inhale 2 puffs into the lungs every 4 (four) hours as needed. 18 g Becky Augusta, NP   Spacer/Aero-Holding Chambers (AEROCHAMBER MV) inhaler Use as instructed 1 each Becky Augusta, NP   benzonatate (TESSALON) 100 MG capsule Take 2 capsules (200 mg total) by mouth every 8 (eight) hours. 21 capsule Becky Augusta, NP   promethazine-dextromethorphan (PROMETHAZINE-DM) 6.25-15 MG/5ML syrup Take 5 mLs by mouth 4 (four) times daily as needed. 118 mL Becky Augusta, NP     PDMP not reviewed this encounter.   Becky Augusta, NP 03/03/21 1214

## 2021-03-03 NOTE — Discharge Instructions (Addendum)
You have tested positive for COVID-19 and therefore you will have to quarantine for 5 days from the start of your symptoms.  After 5 days you can break quarantine if your symptoms have improved and you have not had a fever for 24 hours without taking Tylenol or ibuprofen.  Use over-the-counter Tylenol and ibuprofen as needed for body aches and fever.  Use the Tessalon Perles during the day as needed for cough and the Promethazine DM cough syrup at nighttime as will make you drowsy.  Use the albuterol inhaler with a spacer, 2 puffs every 4-6 hours, as needed for shortness of breath or if you develop any wheezing.  If you develop any increased shortness of breath-especially at rest, you are unable to speak in full sentences, or is a late sign your lips are turning blue you need to go the ER for evaluation.

## 2021-05-10 ENCOUNTER — Other Ambulatory Visit: Payer: Self-pay | Admitting: Obstetrics and Gynecology

## 2021-05-10 DIAGNOSIS — Z1231 Encounter for screening mammogram for malignant neoplasm of breast: Secondary | ICD-10-CM

## 2021-10-10 ENCOUNTER — Other Ambulatory Visit: Payer: Self-pay

## 2021-10-10 ENCOUNTER — Ambulatory Visit
Admission: EM | Admit: 2021-10-10 | Discharge: 2021-10-10 | Disposition: A | Discharge status: Home / Self Care | Payer: 59 | Attending: Physician Assistant | Admitting: Physician Assistant

## 2021-10-10 DIAGNOSIS — J069 Acute upper respiratory infection, unspecified: Secondary | ICD-10-CM | POA: Diagnosis not present

## 2021-10-10 LAB — RAPID INFLUENZA A&B ANTIGENS
Influenza A (ARMC): NEGATIVE
Influenza B (ARMC): NEGATIVE

## 2021-10-10 MED ORDER — DEXAMETHASONE SODIUM PHOSPHATE 10 MG/ML IJ SOLN
10.0000 mg | Freq: Once | INTRAMUSCULAR | Status: AC
  Administered 2021-10-10: 11:00:00 10 mg via INTRAMUSCULAR

## 2021-10-10 MED ORDER — ALBUTEROL SULFATE HFA 108 (90 BASE) MCG/ACT IN AERS
2.0000 | INHALATION_SPRAY | Freq: Once | RESPIRATORY_TRACT | Status: AC
  Administered 2021-10-10: 10:00:00 2 via RESPIRATORY_TRACT

## 2021-10-10 MED ORDER — METHYLPREDNISOLONE ACETATE 40 MG/ML IJ SUSP: 40.0000 mg | Freq: Once | INTRAMUSCULAR | Status: DC

## 2021-10-10 MED ORDER — BENZONATATE 100 MG PO CAPS: 100.0000 mg | ORAL_CAPSULE | Freq: Three times a day (TID) | ORAL | 0 refills | Status: DC

## 2021-10-10 NOTE — ED Provider Notes (Addendum)
[facility]    CSN: [id number] Arrival date & time: 10/10/21  0350      History   Chief Complaint Chief Complaint  Patient presents with   Nasal Congestion    HPI Tonya Woodward is a 45 y.o. female.   Patient presents today with a 3-day history of URI symptoms.  Reports that she is not spearing seeing a significant cough with associated chest tightness and shortness of breath.  She is also experiencing severe sore throat, nasal congestion, body aches.  Denies any nausea, vomiting, diarrhea, chest pain.  Reports flu contacts.  She has had influenza vaccine as well as COVID-19 vaccination.  She has had COVID in the past with last episode approximately 6 months ago.  She denies history of asthma, allergies, COPD.  She does not smoke.  She denies history of diabetes.  She denies any recent antibiotic use.  She has been using over-the-counter medication including Tylenol without improvement of symptoms.   Past Medical History:  Diagnosis Date   Diabetes mellitus without complication (HCC)    Gestational   GERD (gastroesophageal reflux disease)    Heart murmur    Hypothyroidism     Patient Active Problem List   Diagnosis Date Noted   Post-operative state 06/07/2016    Past Surgical History:  Procedure Laterality Date   CESAREAN SECTION N/A 06/07/2016   Procedure: CESAREAN SECTION;  Surgeon: Suzy Bouchard, MD;  Location: [facility] ORS;  Service: Obstetrics;  Laterality: N/A;   DIAGNOSTIC LAPAROSCOPY      OB History     Gravida  2   Para  1   Term  1   Preterm  0   AB  1   Living  1      SAB  1   IAB  0   Ectopic  0   Multiple  0   Live Births  1            Home Medications    Prior to Admission medications   Medication Sig Start Date End Date Taking? Authorizing Provider  benzonatate (TESSALON) 100 MG capsule Take 1 capsule (100 mg total) by mouth every 8 (eight) hours. 10/10/21  Yes Rosalina Dingwall, Noberto Retort, PA-C  Multiple  Vitamins-Minerals (MULTIVITAMIN WITH MINERALS) tablet Take 1 tablet by mouth daily.   Yes [provider]  SYNTHROID 100 MCG tablet Take 100 mcg by mouth daily. 11/16/20  Yes [provider]  Vitamin D, Ergocalciferol, (DRISDOL) 1.25 MG (50000 UNIT) CAPS capsule Take 1 capsule by mouth once a week. 02/05/21  Yes [provider]  albuterol (VENTOLIN HFA) 108 (90 Base) MCG/ACT inhaler Inhale 2 puffs into the lungs every 4 (four) hours as needed. 03/03/21   Becky Augusta, NP  promethazine-dextromethorphan (PROMETHAZINE-DM) 6.25-15 MG/5ML syrup Take 5 mLs by mouth 4 (four) times daily as needed. 03/03/21   Becky Augusta, NP  Spacer/Aero-Holding Deretha Emory (AEROCHAMBER MV) inhaler Use as instructed 03/03/21   Becky Augusta, NP  ferrous sulfate 325 (65 FE) MG tablet Take 1 tablet (325 mg total) by mouth 2 (two) times daily with a meal. For anemia, take with Vitamin C 06/09/16 03/03/21  Christeen Douglas, MD  omeprazole (PRILOSEC) 40 MG capsule Take 40 mg by mouth as needed.  03/03/21  [provider]    Family History History reviewed. No pertinent family history.  Social History Social History   Tobacco Use   Smoking status: Former    Packs/day: 0.25    Types: Cigarettes  Smokeless tobacco: Never  Vaping Use   Vaping Use: Never used  Substance Use Topics   Alcohol use: No   Drug use: No     Allergies   Patient has no known allergies.   Review of Systems Review of Systems  Constitutional:  Positive for activity change, fatigue and fever. Negative for appetite change.  HENT:  Positive for congestion and sore throat. Negative for sinus pressure and sneezing.   Respiratory:  Positive for cough, chest tightness and shortness of breath. Negative for wheezing.   Cardiovascular:  Negative for chest pain.  Gastrointestinal:  Negative for abdominal pain, diarrhea, nausea and vomiting.  Musculoskeletal:  Positive for arthralgias and myalgias.  Neurological:  Negative  for dizziness, light-headedness and headaches.    Physical Exam Triage Vital Signs ED Triage Vitals  Enc Vitals Group     BP 10/10/21 0903 111/71     Pulse Rate 10/10/21 0903 83     Resp 10/10/21 0903 18     Temp 10/10/21 0903 98.7 F (37.1 C)     Temp Source 10/10/21 0903 Oral     SpO2 10/10/21 0903 100 %     Weight 10/10/21 0906 160 lb (72.6 kg)     Height --      Head Circumference --      Peak Flow --      Pain Score 10/10/21 0905 4     Pain Loc --      Pain Edu? --      Excl. in GC? --    No data found.  Updated Vital Signs BP 111/71 (BP Location: Right Arm)    Pulse 83    Temp 98.7 F (37.1 C) (Oral)    Resp 18    Wt 160 lb (72.6 kg)    LMP  (LMP Unknown)    SpO2 100%    BMI 26.63 kg/m   Visual Acuity Right Eye Distance:   Left Eye Distance:   Bilateral Distance:    Right Eye Near:   Left Eye Near:    Bilateral Near:     Physical Exam Vitals reviewed.  Constitutional:      General: She is awake. She is not in acute distress.    Appearance: Normal appearance. She is well-developed. She is not ill-appearing.     Comments: Very pleasant female appears stated age in no acute distress sitting comfortably in exam room  HENT:     Head: Normocephalic and atraumatic.     Right Ear: Tympanic membrane, ear canal and external ear normal. Tympanic membrane is not erythematous or bulging.     Left Ear: Tympanic membrane, ear canal and external ear normal. Tympanic membrane is not erythematous or bulging.     Nose:     Right Sinus: No maxillary sinus tenderness or frontal sinus tenderness.     Left Sinus: No maxillary sinus tenderness or frontal sinus tenderness.     Mouth/Throat:     Pharynx: Uvula midline. Posterior oropharyngeal erythema present. No oropharyngeal exudate.  Cardiovascular:     Rate and Rhythm: Normal rate and regular rhythm.     Heart sounds: Normal heart sounds, S1 normal and S2 normal. No murmur heard. Pulmonary:     Effort: Pulmonary effort is  normal.     Breath sounds: Normal breath sounds. No wheezing, rhonchi or rales.     Comments: Reactive cough with deep breathing Psychiatric:        Behavior: Behavior is cooperative.  UC Treatments / Results  Labs (all labs ordered are listed, but only abnormal results are displayed) Labs Reviewed  RAPID INFLUENZA A&B ANTIGENS    EKG   Radiology No results found.  Procedures Procedures (including critical care time)  Medications Ordered in UC Medications  dexamethasone (DECADRON) injection 10 mg (has no administration in time range)  albuterol (VENTOLIN HFA) 108 (90 Base) MCG/ACT inhaler 2 puff (2 puffs Inhalation Given 10/10/21 0933)    Initial Impression / Assessment and Plan / UC Course  I have reviewed the triage vital signs and the nursing notes.  Pertinent labs & imaging results that were available during my care of the patient were reviewed by me and considered in my medical decision making (see chart for details).     Patient is afebrile and nontoxic-appearing today.  She had significant improvement of tightness in her chest with albuterol and was sent home with albuterol inhaler with instruction to use this every 4-6 hours as needed.  She was given dexamethasone 10 mg in clinic.  Flu testing was negative.  Offered COVID-19 testing patient had low suspicion for this so declined this today.  Discussed likely viral etiology and role of supportive care.  She was given Tessalon for cough and encouraged to use over-the-counter antipyretics/analgesics, Mucinex, Flonase for additional symptom relief.  She is to rest and drink plenty of fluid.  Discussed alarm symptoms that warrant emergent evaluation.  Strict return precautions given to which she expressed understanding.  Final Clinical Impressions(s) / UC Diagnoses   Final diagnoses:  Viral URI with cough     Discharge Instructions      You tested negative for flu.  We did not test you for COVID since you are  not concerned about this.  Use Tessalon up to 3 times a day as needed for cough.  We gave you an injection of steroids today.  This should last for a while and help your symptoms.  Use albuterol every 4-6 hours as needed for shortness of breath, coughing fits, tightness in your chest.  Use Mucinex and Flonase for additional symptom relief.  If you have any worsening symptoms including shortness of breath, high fever, nausea/vomiting interfering with oral intake, fatigue, weakness you need to be reevaluated in the emergency room.     ED Prescriptions     Medication Sig Dispense Auth. Provider   benzonatate (TESSALON) 100 MG capsule Take 1 capsule (100 mg total) by mouth every 8 (eight) hours. 21 capsule Michiah Mudry K, PA-C      PDMP not reviewed this encounter.   Jeani Hawking, PA-C 10/10/21 1017    Jaccob Czaplicki, Noberto Retort, PA-C 10/10/21 1034

## 2021-10-10 NOTE — Discharge Instructions (Signed)
You tested negative for flu.  We did not test you for COVID since you are not concerned about this.  Use Tessalon up to 3 times a day as needed for cough.  We gave you an injection of steroids today.  This should last for a while and help your symptoms.  Use albuterol every 4-6 hours as needed for shortness of breath, coughing fits, tightness in your chest.  Use Mucinex and Flonase for additional symptom relief.  If you have any worsening symptoms including shortness of breath, high fever, nausea/vomiting interfering with oral intake, fatigue, weakness you need to be reevaluated in the emergency room.

## 2021-10-10 NOTE — ED Triage Notes (Signed)
Patient is here today for "Congestion". Started with exposure to Flu (daughter). Now "voice loss" that started on "Thursday". Then s/t, chest congestion. Some runny nose. No sob. No new/unexplained rash. Flu vaccine done. COVID19 vaccine done also.

## 2021-11-15 ENCOUNTER — Other Ambulatory Visit: Payer: Self-pay | Admitting: Orthopedic Surgery

## 2021-11-15 DIAGNOSIS — G8929 Other chronic pain: Secondary | ICD-10-CM

## 2021-12-08 ENCOUNTER — Ambulatory Visit
Admission: RE | Admit: 2021-12-08 | Discharge: 2021-12-08 | Disposition: A | Discharge status: Home / Self Care | Payer: 59 | Source: Ambulatory Visit | Attending: Orthopedic Surgery | Admitting: Orthopedic Surgery

## 2021-12-08 ENCOUNTER — Other Ambulatory Visit: Payer: Self-pay

## 2021-12-08 DIAGNOSIS — G8929 Other chronic pain: Secondary | ICD-10-CM | POA: Insufficient documentation

## 2021-12-08 DIAGNOSIS — M25531 Pain in right wrist: Secondary | ICD-10-CM | POA: Insufficient documentation

## 2022-01-31 ENCOUNTER — Encounter: Payer: Self-pay | Admitting: Occupational Therapy

## 2022-01-31 ENCOUNTER — Ambulatory Visit: Payer: 59 | Attending: Sports Medicine | Admitting: Occupational Therapy

## 2022-01-31 DIAGNOSIS — M25531 Pain in right wrist: Secondary | ICD-10-CM | POA: Diagnosis present

## 2022-01-31 DIAGNOSIS — M25631 Stiffness of right wrist, not elsewhere classified: Secondary | ICD-10-CM | POA: Insufficient documentation

## 2022-01-31 NOTE — Therapy (Signed)
?OUTPATIENT OCCUPATIONAL THERAPY ORTHO EVALUATION ? ?Patient Name: Tonya Woodward ?MRN: [id number] ?DOB:March 30, 1976, 46 y.o., female ?Today's Date: 01/31/2022 ? ?PCP: Patient, No Pcp Per (Inactive) ?REFERRING PROVIDER: Jerrel Ivory, DO ? ? OT End of Session - 01/31/22 1816   ? ? Visit Number 1   ? Number of Visits 12   ? Date for OT Re-Evaluation 03/28/22   ? OT Start Time 1520   ? OT Stop Time 1618   ? OT Time Calculation (min) 58 min   ? Activity Tolerance Patient tolerated treatment well   ? Behavior During Therapy [facility] for tasks assessed/performed   ? ?  ?  ? ?  ? ? ?Past Medical History:  ?Diagnosis Date  ? Diabetes mellitus without complication (HCC)   ? Gestational  ? GERD (gastroesophageal reflux disease)   ? Heart murmur   ? Hypothyroidism   ? ?Past Surgical History:  ?Procedure Laterality Date  ? CESAREAN SECTION N/A 06/07/2016  ? Procedure: CESAREAN SECTION;  Surgeon: Suzy Bouchard, MD;  Location: [facility] ORS;  Service: Obstetrics;  Laterality: N/A;  ? DIAGNOSTIC LAPAROSCOPY    ? ?Patient Active Problem List  ? Diagnosis Date Noted  ? Post-operative state 06/07/2016  ? ? ?ONSET DATE: 12/03/21 ? ?REFERRING DIAG:Wrist pain, ECU tendinopathy. 2nd/3rd intersection syndrome ? ?THERAPY DIAG:  ?Pain in right wrist ? ?Stiffness of right wrist, not elsewhere classified ? ?SUBJECTIVE:  ? ?SUBJECTIVE STATEMENT: ?I probably has my symptoms for about a year but was just favoring it and ignoring it working around it.  I do work on the computer 5 days a week, I change my mouse already.  For the last 2 years working from home 3 days a week so I need to look at my desk set up.  And then I do [employer] 5 to 7 days a week and they have to modify some of my workouts because my wrist pain is so bad. ?Pt accompanied by: self ? ?PERTINENT HISTORY: Assessment:  ? ?ICD-10-CM  ?1. 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 ?Plan:  ? ?I have discussed the nature of her current subjective complaints, clinical examination, test results and have reviewed treatment options. The plan is to do the following; ? ?- The patient has chronic right wrist pain of unknown etiology without known injury. She does have recent MRI showing some edema around the dorsal wrist and second/third dorsal compartment tendons as well as ECU tendinopathy. Her symptoms are under better control today. I explained the possible cause of her symptoms could be wrist joint synovitis versus tendinitis. She has had evaluation by rheumatology. They suspect her ANA is just a normal variation and that she does not have any specific underlying rheumatoid condition, however, she still does have some polyarthralgia and may want to consider second opinion in the future. ?- She does not have significant symptoms today. She has some tenderness palpation and decreased range of motion of the wrist but this is not as severe as it normally is to her. I discussed the uncertainty of the exact cause of her symptoms but basic conservative treatments moving forward with activity modification, bracing, home exercise, physical therapy, medication, ice/compression, heat/massage, topical pain cream, steroid injection. Because her symptoms are not too bad at this point, I recommended occupational therapy to see if this can provide her with more symptom relief exercises to better support  the wrist. ?- Activity as tolerated. Modify as needed according to symptoms. No limitations to pain-free weight bearing but I did recommend using a neutral wrist position. She can use her short arm wrist brace as needed. ?- Refer to PT for pain relieving modalities, manual techniques as indicated including dry needling and/for instrument assisted soft tissue techniques, home exercise planning, iontophoresis. Continue home exercise program to maintain strength, flexibility, and endurance. ?- Use  Tylenol, anti-inflammatories, topical diclofenac/pain cream, relative rest, elevation, compression, massage, and ice/heat as needed for pain.  ?- Follow up as needed depending on flare of symptoms. We can expedite reevaluation if she has a significant flare and consider diagnostic/therapeutic injection at that time.  ? ?PRECAUTIONS: None ? ? ?PAIN:  ?Are you having pain?  Pain in session was 3-4/10 at rest mostly in dorsal wrist, patient's pain the last week was 5-6/10 on dorsal wrist with wrist extension and weightbearing the worst ? ?FALLS: Has patient fallen in last 6 months?  No ? ? ?PATIENT GOALS : Want my pain better so I can do my work and do my workouts and play with my daughter ?OBJECTIVE:  ? [facility] OT Assessment - 01/31/22 0001   ? ?  ? Assessment  ? Medical Diagnosis Mild ECU R tenosynotivits, 2nd and 3rd ext intersection compression   ? Referring Provider (OT) DR Landry MellowKubinski   ? Onset Date/Surgical Date 12/03/21   ? Hand Dominance Right   ?  ? Home  Environment  ? Lives With Family   ?  ? Prior Function  ? Vocation Full time employment   ? 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  ? AROM  ? Right Forearm Pronation --   pain  ? Right Forearm Supination --   pain  ? Right Wrist Extension 45 Degrees   open hand, loose fist 52 and pain  ? Right Wrist Flexion 90 Degrees   75 with loose fist - pull ECU  ? ?  ?  ? ?  ?  ? ?TODAY'S TREATMENT:  ?After assessment done contrast for patient to decrease pain.  Patient was fitted with a wrist immobilization splint to use 24/7-off with ADLs, bathing dressing, eating and home program twice a day.  Doing contrast and pain-free active range of motion.  Patient to keep pain under 2/10.  Reviewed modifications to work out and using computer to decrease pain. ? ? ?PATIENT EDUCATION: ?Patient was educated in home program as well as modifications, and splint fitting.  Handout was provided and patient  educated and reviewed on home program able to verbalize and demo understanding ?HOME EXERCISE PROGRAM: ?Patient to do contrast 2 times a day followed by active range of motion for wrist flexion extension and radial deviation with pain under to stop when feeling a pull.  As well as tendon glides.  Modifications to test to decrease irritation of extensors. ?GOALS: ?Goals reviewed with patient? Yes ? ?SHORT TERM GOALS: Target date: 02/21/2022 ? ?Patient to be independent in home program of wearing a splint to show increased active range of motion with pain less than 2/10 ?Baseline: Pain in session 4/10; this past week pain was 5-6/10 at dorsal wrist patient so decreased wrist extension and flexion ?Goal status: INITIAL ? ? ?LONG TERM GOALS: Target date: 03/28/2022 ? ?Patient's right wrist active range of motion increase to within normal limits with out increased symptoms  in all planes ?Baseline: Wrist pain mostly on dorsal wrist 4-6/10 with use this past week and in session with decreased wrist extension and flexion ?Goal status: INITIAL ? ?2.  Right wrist pain and active range of motion improve to initiate strengthening and patient able to wean out of wrist splint ?Baseline: Patient wearing a wrist splint starting this date 24/7 off with ADLs and home program.  Patient has 4-6/10 dorsal wrist pain with active range of motion and use.  Cannot tolerate any strengthening ?Goal status: INITIAL ? ?3.  Right wrist active range of motion and strength in proved with no increase symptoms for patient to push and pull heavy door use and ADLs with no increase symptoms. ?Baseline: Not able to do any resistance or strengthening without pain, showed decreased active range of motion, 4-6/10 pain on dorsal wrist with use ?Goal status: INITIAL ? ? ?ASSESSMENT: ? ?CLINICAL IMPRESSION: ?Patient presented OT evaluation with increased right dominant wrist pain.  Pain 4-6/10 over dorsal wrist most of the time with repetitive use and unable to  do weightbearing.  Patient had an MRI that showed ECU tendinoplasty as well as 2nd and 3rd extensor intersection syndrome at right wrist.  Patient report having her symptoms for about a year but it increased gr

## 2022-02-08 ENCOUNTER — Encounter: Payer: Self-pay | Admitting: Occupational Therapy

## 2022-02-08 ENCOUNTER — Ambulatory Visit: Payer: 59 | Admitting: Occupational Therapy

## 2022-02-08 DIAGNOSIS — M25631 Stiffness of right wrist, not elsewhere classified: Secondary | ICD-10-CM

## 2022-02-08 DIAGNOSIS — M25531 Pain in right wrist: Secondary | ICD-10-CM

## 2022-02-08 NOTE — Therapy (Signed)
[place] [facility] ?[address] ?[place], [place], [address] ?Phone: [phone]   Fax:  [phone] ? ?Occupational Therapy Treatment ? ?Patient Details  ?Name: Tonya Woodward ?MRN: [id number] ?Date of Birth: [birth year] ?Referring Provider (OT): DR Landry Mellow ? ? ?Encounter Date: 02/08/2022 ? ? OT End of Session - 02/08/22 1718   ? ? Visit Number 2   ? Number of Visits 12   ? Date for OT Re-Evaluation 03/28/22   ? OT Start Time 1449   ? OT Stop Time 1534   ? OT Time Calculation (min) 45 min   ? Activity Tolerance Patient tolerated treatment well   ? Behavior During Therapy [facility] for tasks assessed/performed   ? ?  ?  ? ?  ? ? ?Past Medical History:  ?Diagnosis Date  ? Diabetes mellitus without complication (HCC)   ? Gestational  ? GERD (gastroesophageal reflux disease)   ? Heart murmur   ? Hypothyroidism   ? ? ?Past Surgical History:  ?Procedure Laterality Date  ? CESAREAN SECTION N/A 06/07/2016  ? Procedure: CESAREAN SECTION;  Surgeon: Suzy Bouchard, MD;  Location: [facility] ORS;  Service: Obstetrics;  Laterality: N/A;  ? DIAGNOSTIC LAPAROSCOPY    ? ? ?There were no vitals filed for this visit. ? ? Subjective Assessment - 02/08/22 1716   ? ? Subjective  I have been at the beach last week and then did work at computer a little bit.  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  I do think my keyboard at home is causing some of my issues.  But the pain is better the last 4 to 5 days my pain at the most worse may be 3-4/10 at the most.  It feels good today.   ? Pertinent History ICD-10-CM   1. 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 The plan is to do the following;     - The patient has chronic right wrist pain of unknown etiology without known injury. She does have recent MRI showing some edema around the dorsal wrist and second/third dorsal compartment tendons as well as ECU tendinopathy. Her symptoms are under better control today. I explained the possible cause of her symptoms could be wrist joint synovitis versus tendinitis. She has had evaluation by rheumatology. They suspect her ANA is just a normal variation and that she does not have any specific underlying rheumatoid condition, however, she still does have some polyarthralgia and may want to consider second opinion in the future.  - She does not have significant symptoms today. She has some tenderness palpation and decreased range of motion of the wrist but this is not as severe as it normally is to her. I discussed the uncertainty of the exact cause of her symptoms but basic conservative treatments moving forward with activity modification, bracing, home exercise, physical therapy, medication, ice/compression, heat/massage, topical pain cream, steroid injection. Because her symptoms are not too bad at this point, I recommended occupational therapy to see if this can provide her with more symptom relief exercises to better support the wrist.  - Activity as tolerated. Modify as needed according to symptoms. No limitations  to pain-free weight bearing but I did recommend using a neutral wrist position. She can use her short arm wrist brace as needed.  - Refer to PT for pain relieving modalities, manual techniques as indicated including dry needling and/for instrument assisted soft tissue techniques, home exercise planning, iontophoresis. Continue home exercise program to maintain strength, flexibility, and endurance.  - Use Tylenol, anti-inflammatories, topical diclofenac/pain cream, relative rest, elevation, compression, massage, and ice/heat as needed for pain.   - Follow up as needed depending on flare of symptoms. We  can expedite reevaluation if she has a significant flare and consider diagnostic/therapeutic injection at that time.   ? Patient Stated Goals Want my pain better so I can do my work and do my workouts and play with my daughter   ? Currently in Pain? Yes   ? Pain Score 3    ? Pain Location Wrist   ? Pain Orientation Right   ? Pain Descriptors / Indicators Aching;Tightness   pull  ? Pain Type Acute pain   ? Pain Onset More than a month ago   ? Pain Frequency Intermittent   ? ?  ?  ? ?  ? ? ? ? ? [facility] OT Assessment - 02/08/22 0001   ? ?  ? AROM  ? Right Forearm Pronation --   No pain  ? Right Forearm Supination --   No pain  ? Right Wrist Extension 55 Degrees   open hand , loose fist 50 and pain  ? Right Wrist Flexion 90 Degrees   open hand ,- 75 loose fist and pull  ? ?  ?  ? ?  ? ? ? ?  Patient arrived this date after being off from work l and doing as well only 2 [employer] sessions this week. ?Patient reports after looking in after last session do think her workstation with keyboard at home is causing some of the issues.  Is higher up and not the right height. ?Heart wrist brace having a hard time on the computer with.  And she has sideways and ergonomic mouse at both desk ?Patient do show decreased pain compared to with the evaluation at wrist pain mostly appearing to be over ECU as well as extensors more proximal. ?Finger extensions of second and third digit with resistance better no pain this date. ?No pain with supination, pronation, radial and ulnar deviation ?No pain with opposition, thumb palmar , radial abduction and fisting ? ? ? ? ? ? ? OT Treatments/Exercises (OP) - 02/08/22 0001   ? ?  ? RUE Fluidotherapy  ? Number Minutes Fluidotherapy 8 Minutes   ? RUE Fluidotherapy Location Hand;Wrist   ? Comments AROM prior to soft tissue and AAROM stretche pain free   ? ?  ?  ? ?  ? ?TODAY'S TREATMENT:  ?After assessment done Fluido with patient to decrease pain.  Prior to soft tissue massage using [place] #2 to on  volar and dorsal forearm and wrist as well as in the hand sweeping technique prior to active assisted range of motion by OT pain-free range for flexion and extension of wrist with open hand and loose fist.  ?As well as thumb palmar and radial abduction AAROM.   ?Patient was fitted with a wrist immobilization splint to use 24/7 last time-also provided patient with a Benik neoprene wrist wrap to use on computer and keyboard. ?Discussed with patient extensively about her workstation, keyboard-mouse. ?Recommend for patient to try her previous old mouse with a  silicone support to allow wrist more in neutral and not in extension. ?Also looking into ergonomically correct height of keyboard at home. ?Heart resplint off with ADLs, bathing dressing, eating and home program twice a day.  Doing contrast and pain-free active range of motion.  Forward flexion and extension open hand and loose fist, as well as radial ulnar deviation active assisted range of motion palms together.  In supination pronation.  ? Patient to keep pain under 2/10.  Reviewed modifications to work out and using computer to decrease pain. ?  ?  ?PATIENT EDUCATION: ?Patient was educated in home program as well as modifications, and splint fitting.  Handout was provided and patient educated and reviewed on home program able to verbalize and demo understanding ?HOME EXERCISE PROGRAM: ?See treatment note ?GOALS: ?Goals reviewed with patient? Yes ?  ?SHORT TERM GOALS: Target date: 02/21/2022 ?  ?Patient to be independent in home program of wearing a splint to show increased active range of motion with pain less than 2/10 ?Baseline: Pain in session 4/10; this past week pain was 5-6/10 at dorsal wrist patient so decreased wrist extension and flexion ?Goal status: INITIAL ?  ?  ?LONG TERM GOALS: Target date: 03/28/2022 ?  ?Patient's right wrist active range of motion increase to within normal limits with out increased symptoms in all planes ?Baseline: Wrist pain mostly  on dorsal wrist 4-6/10 with use this past week and in session with decreased wrist extension and flexion ?Goal status: INITIAL ?  ?2.  Right wrist pain and active range of motion improve to initiate

## 2022-02-15 ENCOUNTER — Ambulatory Visit: Payer: 59 | Admitting: Occupational Therapy

## 2022-02-15 DIAGNOSIS — M25531 Pain in right wrist: Secondary | ICD-10-CM | POA: Diagnosis not present

## 2022-02-15 DIAGNOSIS — M25631 Stiffness of right wrist, not elsewhere classified: Secondary | ICD-10-CM

## 2022-02-15 NOTE — Therapy (Signed)
[place] ?[facility] ?[address] ?[place], [place], [address] ?Phone: [phone]   Fax:  [phone] ? ?Occupational Therapy Treatment ? ?Patient Details  ?Name: Tonya Woodward ?MRN: [id number] ?Date of Birth: [birth year] ?Referring Provider (OT): DR Landry Mellow ? ? ?Encounter Date: 02/15/2022 ? ? OT End of Session - 02/15/22 1726   ? ? Visit Number 3   ? Number of Visits 12   ? Date for OT Re-Evaluation 03/28/22   ? OT Start Time 1535   ? OT Stop Time 1620   ? OT Time Calculation (min) 45 min   ? Activity Tolerance Patient tolerated treatment well   ? Behavior During Therapy [facility] for tasks assessed/performed   ? ?  ?  ? ?  ? ? ?Past Medical History:  ?Diagnosis Date  ? Diabetes mellitus without complication (HCC)   ? Gestational  ? GERD (gastroesophageal reflux disease)   ? Heart murmur   ? Hypothyroidism   ? ? ?Past Surgical History:  ?Procedure Laterality Date  ? CESAREAN SECTION N/A 06/07/2016  ? Procedure: CESAREAN SECTION;  Surgeon: Suzy Bouchard, MD;  Location: [facility] ORS;  Service: Obstetrics;  Laterality: N/A;  ? DIAGNOSTIC LAPAROSCOPY    ? ? ?There were no vitals filed for this visit. ? ? Subjective Assessment - 02/15/22 1724   ? ? Subjective  I am doing better I changed the mouse to a regular mouse I did a silicone support.  I ordered a new ergonomic desk for me for home.  The index finger and middle finger pain is gone.  But in the side of my wrist and the top still bothering me but not more than a 2-3/10. I feel like I would really try my best not to overdo it when I work out to doing what you told me.  Thank you   ? Pertinent History ICD-10-CM   1. 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 The  plan is to do the following;    - The patient has chronic right wrist pain of unknown etiology without known injury. She does have recent MRI showing some edema around the dorsal wrist and second/third dorsal compartment tendons as well as ECU tendinopathy. Her symptoms are under better control today. I explained the possible cause of her symptoms could be wrist joint synovitis versus tendinitis. She has had evaluation by rheumatology. They suspect her ANA is just a normal variation and that she does not have any specific underlying rheumatoid condition, however, she still does have some polyarthralgia and may want to consider second opinion in the future.  - She does not have significant symptoms today. She has some tenderness palpation and decreased range of motion of the wrist but this is not as severe as it normally is to her. I discussed the uncertainty of the exact cause of her symptoms but basic conservative treatments moving forward with activity modification, bracing, home exercise, physical therapy, medication, ice/compression, heat/massage, topical pain cream, steroid injection. Because her symptoms are not too bad at this point, I recommended occupational therapy to see if this can provide her with more symptom relief exercises to better support the wrist.  - Activity as tolerated. Modify  as needed according to symptoms. No limitations to pain-free weight bearing but I did recommend using a neutral wrist position. She can use her short arm wrist brace as needed.  - Refer to PT for pain relieving modalities, manual techniques as indicated including dry needling and/for instrument assisted soft tissue techniques, home exercise planning, iontophoresis. Continue home exercise program to maintain strength, flexibility, and endurance.  - Use Tylenol, anti-inflammatories, topical diclofenac/pain cream, relative rest, elevation, compression, massage, and ice/heat as needed for pain.   - Follow up as needed  depending on flare of symptoms. We can expedite reevaluation if she has a significant flare and consider diagnostic/therapeutic injection at that time.   ? Patient Stated Goals Want my pain better so I can do my work and do my workouts and play with my daughter   ? Currently in Pain? Yes   ? Pain Score 2    ? Pain Location Wrist   ? Pain Orientation Right   ? Pain Descriptors / Indicators Aching;Tightness   ? Pain Type Acute pain   ? Pain Onset More than a month ago   ? Pain Frequency Intermittent   ? ?  ?  ? ?  ? ? ? ? ? [facility] OT Assessment - 02/15/22 0001   ? ?  ? AROM  ? Right Forearm Pronation --   pain last end range pronation  ? Right Wrist Extension 55 Degrees   open hand and loose fist  ? Right Wrist Flexion 90 Degrees   open hand , loose fist 80  ? ?  ?  ? ?  ? ? ? ? ? ? ? ? ? ? ? OT Treatments/Exercises (OP) - 02/15/22 0001   ? ?  ? RUE Fluidotherapy  ? Number Minutes Fluidotherapy 8 Minutes   ? RUE Fluidotherapy Location Hand;Wrist   ? Comments AROM for wrist in all planes prior to soft tissue   ? ?  ?  ? ?  ? ? ?Patient arrived this date after being off from work l and doing as well only 2 [employer] this week. ?Patient reports after looking in after last session do think her workstation with keyboard at home is causing some of the issues.  Is higher up and not the right height. ?Heart wrist brace having a hard time on the computer with.  And she has sideways and ergonomic mouse at both desk ?Patient do show decreased pain compared to with the evaluation at wrist pain mostly appearing to be over ECU as well as extensors more proximal. ?Finger extensions of second and third digit with resistance better no pain this date. ?No pain with supination, pronation, radial and ulnar deviation ?No pain with opposition, thumb palmar , radial abduction and fisting ?  ?  ?  ?  ?  ?  ?  OT Treatments/Exercises (OP) - 02/08/22 0001   ?  ?    ?     ?  RUE Fluidotherapy  ?  Number Minutes Fluidotherapy 8 Minutes    ?  RUE Fluidotherapy Location Hand;Wrist   ?  Comments AROM prior to soft tissue and AAROM stretche pain free   ?  ?   ?  ?  ?   ?  ?TODAY'S TREATMENT:  ?After assessment done Fluido with patient to decrease pain.  Prior to soft tissue massage using Grasston #2 to on volar and dorsal forearm and wrist  pain-free range for flexion and extension of wrist with open hand  and loose fist.  As well as radial ulnar deviation. ?As well as thumb palmar and radial abduction AAROM.   ?Patient was fitted with a wrist immobilization splint to use most of the time-also provided patient with a Benik neoprene wrist wrap to use on computer and keyboard last time. ?Discussed with patient extensively last visit about her workstation, keyboard-mouse. ?R patient feels like changing her mouse to a regular mouse again with silicone wrist support doing better.  Patient did not have pain today in second and third extensors of digits. ? ? Wrist immobilization splint off with ADLs, bathing dressing, eating and home program twice a day.  Doing contrast and pain-free active range of motion.  At this date active assisted range of motion for wrist flexion extension over edge of table keeping pain-free patient showed 5 degrees of increased extension and flexion.  Patient to do at home prior to active  flexion and extension open hand and loose fist wrist flexion extension.  Radial and ulnar deviation active assisted range of motion palms together.  And continue supination pronation.  ? Patient to keep pain under 2/10.  Reviewed modifications to work out and using computer to decrease pain. ?  ?  ?PATIENT EDUCATION: ?Patient was educated in home program as well as modifications, and splint fitting.  Handout was provided and patient educated and reviewed on home program able to verbalize and demo understanding ?HOME EXERCISE PROGRAM: ?See treatment note ?GOALS: ?Goals reviewed with patient? Yes ?  ?SHORT TERM GOALS: Target date: 02/21/2022 ?  ?Patient  to be independent in home program of wearing a splint to show increased active range of motion with pain less than 2/10 ?Baseline: Pain in session 4/10; this past week pain was 5-6/10 at dorsal wrist patien

## 2022-02-21 ENCOUNTER — Ambulatory Visit: Payer: 59 | Attending: Sports Medicine | Admitting: Occupational Therapy

## 2022-02-21 DIAGNOSIS — M25531 Pain in right wrist: Secondary | ICD-10-CM | POA: Insufficient documentation

## 2022-02-21 DIAGNOSIS — M25631 Stiffness of right wrist, not elsewhere classified: Secondary | ICD-10-CM | POA: Insufficient documentation

## 2022-02-21 NOTE — Therapy (Signed)
[place] ?[facility] ?[address] ?[place], [place], [address] ?Phone: [phone]   Fax:  [phone] ? ?Occupational Therapy Treatment ? ?Patient Details  ?Name: Tonya Woodward ?MRN: [id number] ?Date of Birth: 06/06/1976 ?Referring Provider (OT): DR Landry MellowKubinski ? ? ?Encounter Date: 02/21/2022 ? ? OT End of Session - 02/21/22 1159   ? ? Visit Number 4   ? Number of Visits 12   ? Date for OT Re-Evaluation 03/28/22   ? OT Start Time 1200   ? OT Stop Time 1241   ? OT Time Calculation (min) 41 min   ? Activity Tolerance Patient tolerated treatment well   ? Behavior [facility] for tasks assessed/performed   ? ?  ?  ? ?  ? ? ?Past Medical History:  ?Diagnosis Date  ? Diabetes mellitus without complication (HCC)   ? Gestational  ? GERD (gastroesophageal reflux disease)   ? Heart murmur   ? Hypothyroidism   ? ? ?Past Surgical History:  ?Procedure Laterality Date  ? CESAREAN SECTION N/A 06/07/2016  ? Procedure: CESAREAN SECTION;  Surgeon: Suzy Bouchardhomas J Schermerhorn, MD;  Location: [facility] ORS;  Service: Obstetrics;  Laterality: N/A;  ? DIAGNOSTIC LAPAROSCOPY    ? ? ?There were no vitals filed for this visit. ? ? Subjective Assessment - 02/21/22 1159   ? ? Subjective  I was doing good until last night when going to bed and felt pain in my wrist.  Exercise doing okay.  I did get a new desk and works much better is amazing how much I compensated with the old 1.   ? Pertinent History ICD-10-CM   1. 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 The plan is to do the following;    - The patient has chronic right wrist pain of unknown etiology without known injury. She does have recent MRI showing some edema around the dorsal wrist  and second/third dorsal compartment tendons as well as ECU tendinopathy. Her symptoms are under better control today. I explained the possible cause of her symptoms could be wrist joint synovitis versus tendinitis. She has had evaluation by rheumatology. They suspect her ANA is just a normal variation and that she does not have any specific underlying rheumatoid condition, however, she still does have some polyarthralgia and may want to consider second opinion in the future.  - She does not have significant symptoms today. She has some tenderness palpation and decreased range of motion of the wrist but this is not as severe as it normally is to her. I discussed the uncertainty of the exact cause of her symptoms but basic conservative treatments moving forward with activity modification, bracing, home exercise, physical therapy, medication, ice/compression, heat/massage, topical pain cream, steroid injection. Because her symptoms are not too bad at this point, I recommended occupational therapy to see if this can provide her with more symptom relief exercises to better support the wrist.  - Activity as tolerated. Modify as needed according to symptoms. No limitations to pain-free weight bearing but I did recommend using a neutral wrist position. She can use her short arm wrist brace as needed.  - Refer to PT for pain relieving modalities, manual techniques as  indicated including dry needling and/for instrument assisted soft tissue techniques, home exercise planning, iontophoresis. Continue home exercise program to maintain strength, flexibility, and endurance.  - Use Tylenol, anti-inflammatories, topical diclofenac/pain cream, relative rest, elevation, compression, massage, and ice/heat as needed for pain.   - Follow up as needed depending on flare of symptoms. We can expedite reevaluation if she has a significant flare and consider diagnostic/therapeutic injection at that time.   ? Patient Stated Goals Want my pain  better so I can do my work and do my workouts and play with my daughter   ? Currently in Pain? Yes   ? Pain Score 3    ? Pain Location Wrist   ? Pain Orientation Right   ? Pain Descriptors / Indicators Aching;Tightness;Tender   ? Pain Type Acute pain   ? Pain Onset More than a month ago   ? Pain Frequency Intermittent   ? ?  ?  ? ?  ? ? ? ? ? ? ? ? ? ? ? ? ? ? ? OT Treatments/Exercises (OP) - 02/21/22 0001   ? ?  ? Iontophoresis  ? Type of Iontophoresis Dexamethasone   ? Location Dorsal wrist over ECU and to wrist extensors   ? Dose med patch , 2.0 current   ? Time 19   ?  ? RUE Fluidotherapy  ? Number Minutes Fluidotherapy 8 Minutes   ? RUE Fluidotherapy Location Hand;Wrist   ? Comments AROM for wrist   ? ?  ?  ? ?  ? ? ?TODAY'S TREATMENT:  ?Patient did get a new computer desk at home with better ergonomic placement of wrist elbow and shoulders.   ?After assessment that was the same with pain over ECU and a little bit over the extensor tendon of second digit to today done Fluido with patient to decrease pain.  Prior to soft tissue massage using Grasston #2 to on volar and dorsal forearm and wrist  pain-free range for flexion and extension of wrist with open hand and loose fist.  As well as radial ulnar deviation. ?As well as thumb palmar and radial abduction AAROM.   ?Patient was fitted with a wrist immobilization splint to use most of the time-also provided patient with a Benik neoprene wrist wrap to use on computer and keyboard last time. ?Discussed with patient extensively last visits about her workstation, keyboard-mouse. ?R patient feels like changing her mouse to a regular mouse again with silicone wrist support doing better.  Patient did not have pain today in second and third extensors of digits. ?  ? Wrist immobilization splint off with ADLs, bathing dressing, eating and home program twice a day.  Doing contrast and pain-free active range of motion.  At this date active assisted range of motion for wrist  flexion extension over edge of table keeping pain-free patient.  Patient arrived today with same wrist extension with pain at about 50 to 55 degrees of extension.   ?At ionto to session today over ECU as well as second digit extensor at wrist -patient tolerating well skin check done afterwards no issues.   ?  Radial and ulnar deviation active assisted range of motion palms together.  And continue supination pronation.  ? Patient to keep pain under 2/10.  Reviewed modifications to work out and using computer to decrease pain. ?  ?  ?PATIENT EDUCATION: ?Patient was educated in home program as well as modifications, and splint fitting.  Handout was provided and patient educated and reviewed  on home program able to verbalize and demo understanding ?HOME EXERCISE PROGRAM: ?See treatment note ?GOALS: ?Goals reviewed with patient? Yes ?  ?SHORT TERM GOALS: Target date: 02/21/2022 ?  ?Patient to be independent in home program of wearing a splint to show increased active range of motion with pain less than 2/10 ?Baseline: Pain in session 4/10; this past week pain was 5-6/10 at dorsal wrist patient so decreased wrist extension and flexion ?Goal status: INITIAL ?  ?  ?LONG TERM GOALS: Target date: 03/28/2022 ?  ?Patient's right wrist active range of motion increase to within normal limits with out increased symptoms in all planes ?Baseline: Wrist pain mostly on dorsal wrist 4-6/10 with use this past week and in session with decreased wrist extension and flexion ?Goal status: INITIAL ?  ?2.  Right wrist pain and active range of motion improve to initiate strengthening and patient able to wean out of wrist splint ?Baseline: Patient wearing a wrist splint starting this date 24/7 off with ADLs and home program.  Patient has 4-6/10 dorsal wrist pain with active range of motion and use.  Cannot tolerate any strengthening ?Goal status: INITIAL ?  ?3.  Right wrist active range of motion and strength in proved with no increase symptoms for  patient to push and pull heavy door use and ADLs with no increase symptoms. ?Baseline: Not able to do any resistance or strengthening without pain, showed decreased active range of motion, 4-6/10 pain

## 2022-02-25 ENCOUNTER — Ambulatory Visit: Payer: 59 | Admitting: Occupational Therapy

## 2022-02-25 DIAGNOSIS — M25631 Stiffness of right wrist, not elsewhere classified: Secondary | ICD-10-CM

## 2022-02-25 DIAGNOSIS — M25531 Pain in right wrist: Secondary | ICD-10-CM

## 2022-02-25 NOTE — Therapy (Signed)
[place] ?[facility] ?[address] ?[place], [place], [address] ?Phone: [phone]   Fax:  [phone] ? ?Occupational Therapy Treatment ? ?Patient Details  ?Name: Curt BearsMartha R Hauk ?MRN: [id number] ?Date of Birth: 03/20/1976 ?Referring Provider (OT): DR Landry MellowKubinski ? ? ?Encounter Date: 02/25/2022 ? ? OT End of Session - 02/25/22 1156   ? ? Visit Number 5   ? Number of Visits 12   ? Date for OT Re-Evaluation 03/28/22   ? OT Start Time 0915   ? OT Stop Time 1007   ? OT Time Calculation (min) 52 min   ? Activity Tolerance Patient tolerated treatment well   ? Behavior During Therapy [facility] for tasks assessed/performed   ? ?  ?  ? ?  ? ? ?Past Medical History:  ?Diagnosis Date  ? Diabetes mellitus without complication (HCC)   ? Gestational  ? GERD (gastroesophageal reflux disease)   ? Heart murmur   ? Hypothyroidism   ? ? ?Past Surgical History:  ?Procedure Laterality Date  ? CESAREAN SECTION N/A 06/07/2016  ? Procedure: CESAREAN SECTION;  Surgeon: Suzy Bouchardhomas J Schermerhorn, MD;  Location: [facility] ORS;  Service: Obstetrics;  Laterality: N/A;  ? DIAGNOSTIC LAPAROSCOPY    ? ? ?There were no vitals filed for this visit. ? ? Subjective Assessment - 02/25/22 1149   ? ? Subjective  The last few days my wrist was bothering me a little bit lack last night again.  I change my mouse with the computer for me to be able to click with my middle finger   ? Pertinent History ICD-10-CM   1. 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 The plan is to do the following;    - The patient has chronic right wrist pain of unknown etiology without known injury. She does have recent MRI showing some edema around the dorsal wrist and second/third dorsal  compartment tendons as well as ECU tendinopathy. Her symptoms are under better control today. I explained the possible cause of her symptoms could be wrist joint synovitis versus tendinitis. She has had evaluation by rheumatology. They suspect her ANA is just a normal variation and that she does not have any specific underlying rheumatoid condition, however, she still does have some polyarthralgia and may want to consider second opinion in the future.  - She does not have significant symptoms today. She has some tenderness palpation and decreased range of motion of the wrist but this is not as severe as it normally is to her. I discussed the uncertainty of the exact cause of her symptoms but basic conservative treatments moving forward with activity modification, bracing, home exercise, physical therapy, medication, ice/compression, heat/massage, topical pain cream, steroid injection. Because her symptoms are not too bad at this point, I recommended occupational therapy to see if this can provide her with more symptom relief exercises to better support the wrist.  - Activity as tolerated. Modify as needed according to symptoms. No limitations to pain-free weight bearing but I did recommend using a neutral wrist position. She can use her short arm wrist brace as needed.  - Refer to PT for pain relieving modalities, manual techniques as indicated including dry needling and/for instrument assisted  soft tissue techniques, home exercise planning, iontophoresis. Continue home exercise program to maintain strength, flexibility, and endurance.  - Use Tylenol, anti-inflammatories, topical diclofenac/pain cream, relative rest, elevation, compression, massage, and ice/heat as needed for pain.   - Follow up as needed depending on flare of symptoms. We can expedite reevaluation if she has a significant flare and consider diagnostic/therapeutic injection at that time.   ? Patient Stated Goals Want my pain better so I can do my  work and do my workouts and play with my daughter   ? Currently in Pain? Yes   ? Pain Score 2    ? Pain Location Wrist   ? Pain Orientation Right   ? Pain Descriptors / Indicators Aching;Tender;Tightness   ? Pain Type Acute pain   ? Pain Onset More than a month ago   ? ?  ?  ? ?  ? ? ? ? ? ? ? ? ? ? ? ? ? ? ? OT Treatments/Exercises (OP) - 02/25/22 0001   ? ?  ? Iontophoresis  ? Type of Iontophoresis Dexamethasone   ? Location Dorsal wrist over ECU  ? Dose med patch , 2.0 current   ? Time 19   ?  ? RUE Fluidotherapy  ? Number Minutes Fluidotherapy 8 Minutes   ? RUE Fluidotherapy Location Hand;Wrist   ? Comments AROM for wrist - decrease pain increase motion   ? ?  ?  ? ?  ? ? ?TODAY'S TREATMENT:  ?Patient did get a new computer desk at home with better ergonomic placement of wrist elbow and shoulders.   ?This past week patient continues to be limited and have pain over ECU but pain over extensor tendons on second or third digit is better.   ?After Feudo patient has decreased pain but continues to be mostly limited in wrist extension motion to 55 degrees with active assisted range of motion to 60. ? ?Do some light joint traction at wrist and joint mobilization followed by wrist flexion extension and active and active assisted range of motion for wrist flexion and extension with open hand and loose fist.   ? ?Patient continues to wear wrist immobilization splint to use most of the time-also provided patient with a Benik neoprene wrist wrap to use on computer and keyboard last time. ?Discussed with patient extensively in the past about her workstation, keyboard-mouse. ?R patient feels like changing her mouse to a regular mouse again with silicone wrist support doing better.  Patient did not have pain today in second and third extensors of digits. ?  ?  Patient arrived t this date again with same wrist extension with pain at about  55 degrees of extension.   ?Done second ionto  session today over ECU  -patient  tolerating well skin check done afterwards no issues.   ?  Radial and ulnar deviation active assisted range of motion palms together.  And continue supination pronation.  Pain-free ? Patient to keep pain under 2/10.  Reviewed modifications to work out and using computer to decrease pain. ?  ?  ?PATIENT EDUCATION: ?Patient was educated in home program as well as modifications, and splint fitting.  Handout was provided and patient educated and reviewed on home program able to verbalize and demo understanding ?HOME EXERCISE PROGRAM: ?See treatment note ?GOALS: ?Goals reviewed with patient? Yes ?  ?SHORT TERM GOALS: Target date: 02/21/2022 ?  ?Patient to be independent in home program of wearing a splint to show increased active range of  motion with pain less than 2/10 ?Baseline: Pain in session 4/10; this past week pain was 5-6/10 at dorsal wrist patient so decreased wrist extension and flexion ?Goal status: INITIAL ?  ?  ?LONG TERM GOALS: Target date: 03/28/2022 ?  ?Patient's right wrist active range of motion increase to within normal limits with out increased symptoms in all planes ?Baseline: Wrist pain mostly on dorsal wrist 4-6/10 with use this past week and in session with decreased wrist extension and flexion ?Goal status: INITIAL ?  ?2.  Right wrist pain and active range of motion improve to initiate strengthening and patient able to wean out of wrist splint ?Baseline: Patient wearing a wrist splint starting this date 24/7 off with ADLs and home program.  Patient has 4-6/10 dorsal wrist pain with active range of motion and use.  Cannot tolerate any strengthening ?Goal status: INITIAL ?  ?3.  Right wrist active range of motion and strength in proved with no increase symptoms for patient to push and pull heavy door use and ADLs with no increase symptoms. ?Baseline: Not able to do any resistance or strengthening without pain, showed decreased active range of motion, 4-6/10 pain on dorsal wrist with use ?Goal status:  INITIAL ?  ?  ?ASSESSMENT: ?  ?CLINICAL IMPRESSION: ?Patient presented OT evaluation with increased right dominant wrist pain.  Pain 4-6/10 over dorsal wrist most of the time with repetitive use and unable to do weight

## 2022-02-28 ENCOUNTER — Ambulatory Visit: Payer: 59 | Admitting: Occupational Therapy

## 2022-02-28 DIAGNOSIS — M25631 Stiffness of right wrist, not elsewhere classified: Secondary | ICD-10-CM

## 2022-02-28 DIAGNOSIS — M25531 Pain in right wrist: Secondary | ICD-10-CM

## 2022-02-28 NOTE — Therapy (Signed)
[place] ?[place] [facility] ?[address] ?[place], [place], [address] ?Phone: [phone]   Fax:  [phone] ? ?Occupational Therapy Treatment ? ?Patient Details  ?Name: Tonya Woodward ?MRN: [id number] ?Date of Birth: 04-03-1976 ?Referring Provider (OT): DR Candelaria Stagers ? ? ?Encounter Date: 02/28/2022 ? ? OT End of Session - 02/28/22 0901   ? ? Visit Number 6   ? Number of Visits 12   ? Date for OT Re-Evaluation 03/28/22   ? OT Start Time 0901   ? OT Stop Time 0950   ? OT Time Calculation (min) 49 min   ? Activity Tolerance Patient tolerated treatment well   ? Behavior During Therapy [facility] for tasks assessed/performed   ? ?  ?  ? ?  ? ? ?Past Medical History:  ?Diagnosis Date  ? Diabetes mellitus without complication ([place])   ? Gestational  ? GERD (gastroesophageal reflux disease)   ? Heart murmur   ? Hypothyroidism   ? ? ?Past Surgical History:  ?Procedure Laterality Date  ? CESAREAN SECTION N/A 06/07/2016  ? Procedure: CESAREAN SECTION;  Surgeon: Boykin Nearing, MD;  Location: [facility] ORS;  Service: Obstetrics;  Laterality: N/A;  ? DIAGNOSTIC LAPAROSCOPY    ? ? ?There were no vitals filed for this visit. ? ? Subjective Assessment - 02/28/22 0901   ? ? Subjective  Done better since I seen you last time.  Did not really feel it.  I did work out today because I did not work out for 3 days felt with a little bit.   ? Pertinent History ICD-10-CM   1. 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 The plan is to do the following;    - The patient has chronic right wrist pain of unknown etiology without known injury. She does have recent MRI showing some edema around the dorsal wrist and second/third dorsal compartment tendons as  well as ECU tendinopathy. Her symptoms are under better control today. I explained the possible cause of her symptoms could be wrist joint synovitis versus tendinitis. She has had evaluation by rheumatology. They suspect her ANA is just a normal variation and that she does not have any specific underlying rheumatoid condition, however, she still does have some polyarthralgia and may want to consider second opinion in the future.  - She does not have significant symptoms today. She has some tenderness palpation and decreased range of motion of the wrist but this is not as severe as it normally is to her. I discussed the uncertainty of the exact cause of her symptoms but basic conservative treatments moving forward with activity modification, bracing, home exercise, physical therapy, medication, ice/compression, heat/massage, topical pain cream, steroid injection. Because her symptoms are not too bad at this point, I recommended occupational therapy to see if this can provide her with more symptom relief exercises to better support the wrist.  - Activity as tolerated. Modify as needed according to symptoms. No limitations to pain-free weight bearing but I did recommend using a neutral wrist position. She can use her short arm wrist brace as needed.  - Refer to PT for pain relieving modalities, manual techniques as indicated including dry needling and/for instrument assisted soft  tissue techniques, home exercise planning, iontophoresis. Continue home exercise program to maintain strength, flexibility, and endurance.  - Use Tylenol, anti-inflammatories, topical diclofenac/pain cream, relative rest, elevation, compression, massage, and ice/heat as needed for pain.   - Follow up as needed depending on flare of symptoms. We can expedite reevaluation if she has a significant flare and consider diagnostic/therapeutic injection at that time.   ? Patient Stated Goals Want my pain better so I can do my work and do my workouts  and play with my daughter   ? Currently in Pain? No/denies   ? ?  ?  ? ?  ? ? ? ? ? [facility] OT Assessment - 02/28/22 0001   ? ?  ? AROM  ? Right Forearm Pronation --   tight end 5 degrees  ? Right Wrist Extension 60 Degrees   65 in session  ? Right Wrist Flexion 90 Degrees   ? ?  ?  ? ?  ? ? ? ? ? ? ? ? ? ? ? OT Treatments/Exercises (OP) - 02/28/22 0001   ? ?  ? Iontophoresis  ? Type of Iontophoresis Dexamethasone   ? Location Dorsal wrist over ECU and ulnar wrist   ? Dose med patch , 2.0 current   ? Time 19   ?  ? RUE Fluidotherapy  ? Number Minutes Fluidotherapy 8 Minutes   ? RUE Fluidotherapy Location Hand;Wrist   ? Comments AROM for wrist in all planes   ? ?  ?  ? ?  ? ? ?TODAY'S TREATMENT:  ?Patient arrived today with 60 degrees of wrist extension after starting joint mobilization last visit. ?Do some light joint traction at wrist and joint mobilization followed by wrist flexion ,extension and active and active assisted range of motion for wrist flexion and extension with open hand and loose fist afterwards. ?Did [place]ston over volar and dorsal forearm and wrist Grasston #2 tool and sweeping mostly prior to active assisted range of motion ?  ?Patient continues to wear wrist immobilization splint to use most of the time-also provided patient with a Benik neoprene wrist wrap to use on computer and keyboard last time. ?Discussed with patient extensively in the past about her workstation, keyboard-mouse. ?R patient feels like changing her mouse to a regular mouse again with silicone wrist support doing better.  Patient did not have pain today in second and third extensors of digits. ?  ?    ?Done 3rd ionto  session today over ECU and ulnar wrist  -patient tolerating well skin check done afterwards no issues.   ?  Radial and ulnar deviation active assisted range of motion palms together.  And continue supination pronation.  Pain-free ? Patient to keep pain under 2/10.  Reviewed modifications to work out and using  computer to decrease pain. ?  ?  ?PATIENT EDUCATION: ?Patient was educated in home program as well as modifications, and splint fitting.  Handout was provided and patient educated and reviewed on home program able to verbalize and demo understanding ?HOME EXERCISE PROGRAM: ?See treatment note ?GOALS: ?Goals reviewed with patient? Yes ?  ?SHORT TERM GOALS: Target date: 02/21/2022 ?  ?Patient to be independent in home program of wearing a splint to show increased active range of motion with pain less than 2/10 ?Baseline: Pain in session 4/10; this past week pain was 5-6/10 at dorsal wrist patient so decreased wrist extension and flexion ?Goal status: INITIAL ?  ?  ?LONG TERM GOALS: Target date: 03/28/2022 ?  ?  Patient's right wrist active range of motion increase to within normal limits with out increased symptoms in all planes ?Baseline: Wrist pain mostly on dorsal wrist 4-6/10 with use this past week and in session with decreased wrist extension and flexion ?Goal status: INITIAL ?  ?2.  Right wrist pain and active range of motion improve to initiate strengthening and patient able to wean out of wrist splint ?Baseline: Patient wearing a wrist splint starting this date 24/7 off with ADLs and home program.  Patient has 4-6/10 dorsal wrist pain with active range of motion and use.  Cannot tolerate any strengthening ?Goal status: INITIAL ?  ?3.  Right wrist active range of motion and strength in proved with no increase symptoms for patient to push and pull heavy door use and ADLs with no increase symptoms. ?Baseline: Not able to do any resistance or strengthening without pain, showed decreased active range of motion, 4-6/10 pain on dorsal wrist with use ?Goal status: INITIAL ?  ?  ?ASSESSMENT: ?  ?CLINICAL IMPRESSION: ?Patient presented OT evaluation with increased right dominant wrist pain.  Pain 4-6/10 over dorsal wrist most of the time with repetitive use and unable to do weightbearing.  Patient had an MRI that showed ECU  tendinoplasty as well as 2nd and 3rd extensor intersection syndrome at right wrist.  Patient report having her symptoms for about a year but it increased gradually and since February could not sleep at times

## 2022-03-04 ENCOUNTER — Ambulatory Visit: Payer: 59 | Admitting: Occupational Therapy

## 2022-03-04 DIAGNOSIS — M25531 Pain in right wrist: Secondary | ICD-10-CM | POA: Diagnosis not present

## 2022-03-04 DIAGNOSIS — M25631 Stiffness of right wrist, not elsewhere classified: Secondary | ICD-10-CM

## 2022-03-04 NOTE — Therapy (Signed)
[place] ?[facility] ?[address] ?[place], [place], [id number] ?Phone: [phone]   Fax:  [phone] ? ?Occupational Therapy Treatment ? ?Patient Details  ?Name: Tonya Woodward ?MRN: [id number] ?Date of Birth: 05/27/1976 ?Referring Provider (OT): DR Landry MellowKubinski ? ? ?Encounter Date: 03/04/2022 ? ? OT End of Session - 03/04/22 0829   ? ? Visit Number 7   ? Number of Visits 12   ? Date for OT Re-Evaluation 03/28/22   ? OT Start Time 0830   ? OT Stop Time 0919   ? OT Time Calculation (min) 49 min   ? Activity Tolerance Patient tolerated treatment well   ? [facility]/performed   ? ?  ?  ? ?  ? ? ?Past Medical History:  ?Diagnosis Date  ? Diabetes mellitus without complication (HCC)   ? Gestational  ? GERD (gastroesophageal reflux disease)   ? Heart murmur   ? Hypothyroidism   ? ? ?Past Surgical History:  ?Procedure Laterality Date  ? CESAREAN SECTION N/A 06/07/2016  ? Procedure: CESAREAN SECTION;  Surgeon: Suzy Bouchardhomas J Schermerhorn, MD;  Location: [facility] ORS;  Service: Obstetrics;  Laterality: N/A;  ? DIAGNOSTIC LAPAROSCOPY    ? ? ?There were no vitals filed for this visit. ? ? Subjective Assessment - 03/04/22 0828   ? ? Subjective  My wrist has been doing good since Monday.  Pain better.  My desk is much better with my new mouse and change the clicker back again.   ? Pertinent History ICD-10-CM   1. 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 The plan is to do the following;    - The patient has chronic right wrist pain of unknown etiology without known injury. She does have recent MRI showing some edema around the dorsal wrist and second/third dorsal compartment tendons as well as ECU  tendinopathy. Her symptoms are under better control today. I explained the possible cause of her symptoms could be wrist joint synovitis versus tendinitis. She has had evaluation by rheumatology. They suspect her ANA is just a normal variation and that she does not have any specific underlying rheumatoid condition, however, she still does have some polyarthralgia and may want to consider second opinion in the future.  - She does not have significant symptoms today. She has some tenderness palpation and decreased range of motion of the wrist but this is not as severe as it normally is to her. I discussed the uncertainty of the exact cause of her symptoms but basic conservative treatments moving forward with activity modification, bracing, home exercise, physical therapy, medication, ice/compression, heat/massage, topical pain cream, steroid injection. Because her symptoms are not too bad at this point, I recommended occupational therapy to see if this can provide her with more symptom relief exercises to better support the wrist.  - Activity as tolerated. Modify as needed according to symptoms. No limitations to pain-free weight bearing but I did recommend using a neutral wrist position. She can use her short arm wrist brace as needed.  - Refer to PT for pain relieving modalities, manual techniques as indicated including dry needling and/for instrument assisted soft tissue techniques, home exercise planning, iontophoresis. Continue  home exercise program to maintain strength, flexibility, and endurance.  - Use Tylenol, anti-inflammatories, topical diclofenac/pain cream, relative rest, elevation, compression, massage, and ice/heat as needed for pain.   - Follow up as needed depending on flare of symptoms. We can expedite reevaluation if she has a significant flare and consider diagnostic/therapeutic injection at that time.   ? Patient Stated Goals Want my pain better so I can do my work and do my workouts and play with  my daughter   ? ?  ?  ? ?  ? ? ? ? ? [facility] OT Assessment - 03/04/22 0001   ? ?  ? AROM  ? Right Wrist Extension 60 Degrees   PROM 70  ? Right Wrist Flexion 95 Degrees   ? ?  ?  ? ?  ? ? ? ? ? ? ? ? ? ? ? OT Treatments/Exercises (OP) - 03/04/22 0001   ? ?  ? Iontophoresis  ? Type of Iontophoresis Dexamethasone   ? Location Dorsal wrist over ECU and ulnar wrist   ? Dose med patch , 2.0 current   ? Time 19   ?  ? RUE Fluidotherapy  ? Number Minutes Fluidotherapy 8 Minutes   ? RUE Fluidotherapy Location Hand;Wrist   ? Comments AROM prior to PROM /soft tissue   ? ?  ?  ? ?  ? ?TODAY'S TREATMENT:  ?Patient making progress since started last week iontophoresis with dexamethasone as well as joint mobilization to distal radius ulnar joint with carpal rolls.  Do soft tissue and joint mopes after fluidotherapy.   ?Patient with a softer and now for wrist extension where before it was hard and.  Continued to have some tenderness over TFCC area more than ECU.   ?Reviewed with patient again today active assisted range of motion over edge of table for wrist flexion extension-reinforced for patient to do more moving of elbow over the wrist but then using extensors irritating may be the lateral epicondyle.   ?Do some light joint traction at wrist and joint mobilization followed by wrist flexion ,extension and active and active assisted range of motion for wrist flexion and extension with open hand and loose fist afterwards. ?Showed increase active range of motion to 65 wrist extension and passively 70. ?Did Salvadore Oxford over volar and dorsal forearm and wrist Grasston #2 tool and sweeping mostly prior to active assisted range of motion ?  ?Patient continues to wear wrist immobilization splint to use most of the time-also provided patient with a Benik neoprene wrist wrap to use on computer and keyboard last time. ?Discussed with patient extensively in the past about her workstation, keyboard-mouse set up.  Feels like patient had made great  changes with that. ? ?  ?    ?Done 4th ionto  session today over ECU [facility] R wrist  -patient tolerating well skin check done afterwards no issues.   ?  Radial and ulnar deviation active assisted range of motion palms together.  And continue supination pronation.  Pain-free ? Patient to keep pain under 2/10.  Reviewed modifications to work out and using computer to decrease pain from [facility]. ?  ?  ?PATIENT EDUCATION: ?Patient was educated in home program as well as modifications, and splint fitting.  Handout was provided and patient educated and reviewed on home program able to verbalize and demo understanding ?HOME EXERCISE PROGRAM: ?See treatment note ?GOALS: ?Goals reviewed with patient? Yes ?  ?SHORT TERM GOALS: Target date: 02/21/2022 ?  ?Patient to  be independent in home program of wearing a splint to show increased active range of motion with pain less than 2/10 ?Baseline: Pain in session 4/10; this past week pain was 5-6/10 at dorsal wrist patient so decreased wrist extension and flexion ?Goal status: INITIAL ?  ?  ?LONG TERM GOALS: Target date: 03/28/2022 ?  ?Patient's right wrist active range of motion increase to within normal limits with out increased symptoms in all planes ?Baseline: Wrist pain mostly on dorsal wrist 4-6/10 with use this past week and in session with decreased wrist extension and flexion ?Goal status: INITIAL ?  ?2.  Right wrist pain and active range of motion improve to initiate strengthening and patient able to wean out of wrist splint ?Baseline: Patient wearing a wrist splint starting this date 24/7 off with ADLs and home program.  Patient has 4-6/10 dorsal wrist pain with active range of motion and use.  Cannot tolerate any strengthening ?Goal status: INITIAL ?  ?3.  Right wrist active range of motion and strength in proved with no increase symptoms for patient to push and pull heavy door use and ADLs with no increase symptoms. ?Baseline: Not able to do any resistance or strengthening  without pain, showed decreased active range of motion, 4-6/10 pain on dorsal wrist with use ?Goal status: INITIAL ?  ?  ?ASSESSMENT: ?  ?CLINICAL IMPRESSION: ?Patient presented  at OT evaluation with incre

## 2022-03-07 ENCOUNTER — Ambulatory Visit: Payer: 59 | Admitting: Occupational Therapy

## 2022-03-07 DIAGNOSIS — M25631 Stiffness of right wrist, not elsewhere classified: Secondary | ICD-10-CM

## 2022-03-07 DIAGNOSIS — M25531 Pain in right wrist: Secondary | ICD-10-CM | POA: Diagnosis not present

## 2022-03-07 NOTE — Therapy (Signed)
[place] ?[facility] ?[address] ?[place], [place], [address] ?Phone: [phone]   Fax:  [phone] ? ?Occupational Therapy Treatment ? ?Patient Details  ?Name: Tonya Woodward ?MRN: [id number] ?Date of Birth: 11/16/1975 ?Referring Provider (OT): DR Landry Mellow ? ? ?Encounter Date: 03/07/2022 ? ? OT End of Session - 03/07/22 1028   ? ? Visit Number 8   ? Number of Visits 12   ? Date for OT Re-Evaluation 03/28/22   ? OT Start Time [phone]   ? OT Stop Time 1040   ? OT Time Calculation (min) 53 min   ? Activity Tolerance Patient tolerated treatment well   ? Behavior During Therapy [facility] for tasks assessed/performed   ? ?  ?  ? ?  ? ? ?Past Medical History:  ?Diagnosis Date  ? Diabetes mellitus without complication (HCC)   ? Gestational  ? GERD (gastroesophageal reflux disease)   ? Heart murmur   ? Hypothyroidism   ? ? ?Past Surgical History:  ?Procedure Laterality Date  ? CESAREAN SECTION N/A 06/07/2016  ? Procedure: CESAREAN SECTION;  Surgeon: Suzy Bouchard, MD;  Location: [facility] ORS;  Service: Obstetrics;  Laterality: N/A;  ? DIAGNOSTIC LAPAROSCOPY    ? ? ?There were no vitals filed for this visit. ? ? Subjective Assessment - 03/07/22 1026   ? ? Subjective  My wrist has been feeling good, did not had any pain since last time.  Did see Dr. Landry Mellow for my left shoulder.   ? Pertinent History ICD-10-CM   1. 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 The plan is to do the following;    - The patient has chronic right wrist pain of unknown etiology without known injury. She does have recent MRI showing some edema around the dorsal wrist and second/third dorsal compartment tendons as well as ECU tendinopathy. Her  symptoms are under better control today. I explained the possible cause of her symptoms could be wrist joint synovitis versus tendinitis. She has had evaluation by rheumatology. They suspect her ANA is just a normal variation and that she does not have any specific underlying rheumatoid condition, however, she still does have some polyarthralgia and may want to consider second opinion in the future.  - She does not have significant symptoms today. She has some tenderness palpation and decreased range of motion of the wrist but this is not as severe as it normally is to her. I discussed the uncertainty of the exact cause of her symptoms but basic conservative treatments moving forward with activity modification, bracing, home exercise, physical therapy, medication, ice/compression, heat/massage, topical pain cream, steroid injection. Because her symptoms are not too bad at this point, I recommended occupational therapy to see if this can provide her with more symptom relief exercises to better support the wrist.  - Activity as tolerated. Modify as needed according to symptoms. No limitations to pain-free weight bearing but I did recommend using a neutral wrist position. She can use her short arm wrist brace as needed.  - Refer to PT for pain relieving modalities, manual techniques as indicated including dry needling and/for instrument assisted soft tissue techniques, home exercise planning, iontophoresis. Continue home exercise program to  maintain strength, flexibility, and endurance.  - Use Tylenol, anti-inflammatories, topical diclofenac/pain cream, relative rest, elevation, compression, massage, and ice/heat as needed for pain.   - Follow up as needed depending on flare of symptoms. We can expedite reevaluation if she has a significant flare and consider diagnostic/therapeutic injection at that time.   ? Patient Stated Goals Want my pain better so I can do my work and do my workouts and play with my daughter   ?  Currently in Pain? No/denies   ? ?  ?  ? ?  ? ? ? ? ? [facility] OT Assessment - 03/07/22 0001   ? ?  ? AROM  ? Right Wrist Extension 62 Degrees   65 in session, PROM 70  ? Right Wrist Flexion 90 Degrees   ? ?  ?  ? ?  ? ? ? ? ? ? ? ? ? ? ? OT Treatments/Exercises (OP) - 03/07/22 0001   ? ?  ? Iontophoresis  ? Type of Iontophoresis Dexamethasone   ? Location Dorsal wrist over ECU and ulnar wrist   ? Dose med patch , 2.0 current   ? Time 19   ?  ? RUE Fluidotherapy  ? Number Minutes Fluidotherapy 8 Minutes   ? RUE Fluidotherapy Location Hand;Wrist   ? Comments AROM for wrist all planes prior to soft tissue   ? ?  ?  ? ?  ? ?TODAY'S TREATMENT:  ?Patient making progress since started  iontophoresis with dexamethasone as well as joint mobilization to distal radius ulnar joint with carpal rolls.  Do soft tissue and joint mobs after fluidotherapy.   ?Patient with a softer end now for wrist extension where before it was hard and.  Continued to have some tenderness over TFCC area more than ECU.   ?Reviewed with patient again today active assisted range of motion over edge of table for wrist flexion extension-reinforced for patient to do more moving of elbow over the wrist but then using extensors irritating may be the lateral epicondyle.   ?Do some light joint traction at wrist and joint mobilization followed by wrist flexion ,extension and active and active assisted range of motion for wrist flexion and extension with open hand and loose fist afterwards. ?Showed increase active range of motion to 65 wrist extension and passively 70. ?Did Salvadore OxfordGrasston over volar and dorsal forearm and wrist Grasston #2 tool and sweeping mostly prior to active assisted range of motion ?  ?Patient continues to wear wrist immobilization splint 85% of the day -also provided patient with a Benik neoprene wrist wrap to use on computer and keyboard last time. ?Discussed with patient extensively in the past about her workstation, keyboard-mouse set up.   Feels like patient had made great changes with that. ?  ?  ?    ?Done 5th ionto  session today over ECU [facility]/TFCC R wrist  -patient tolerating well skin check done afterwards no issues.   ?  Radial and ulnar deviation active assisted range of motion palms together.  And continue supination pronation.  Pain-free ? Patient to keep pain under 2/10.    ?  ?PATIENT EDUCATION: ?Patient was educated in home program as well as modifications, and splint fitting.  Handout was provided and patient educated and reviewed on home program able to verbalize and demo understanding ?HOME EXERCISE PROGRAM: ?See treatment note ?GOALS: ?Goals reviewed with patient? Yes ?  ?SHORT TERM GOALS: Target date: 02/21/2022 ?  ?Patient to be independent in home program  of wearing a splint to show increased active range of motion with pain less than 2/10 ?Baseline: Pain in session 4/10; this past week pain was 5-6/10 at dorsal wrist patient so decreased wrist extension and flexion ?Goal status: INITIAL ?  ?  ?LONG TERM GOALS: Target date: 03/28/2022 ?  ?Patient's right wrist active range of motion increase to within normal limits with out increased symptoms in all planes ?Baseline: Wrist pain mostly on dorsal wrist 4-6/10 with use this past week and in session with decreased wrist extension and flexion ?Goal status: INITIAL ?  ?2.  Right wrist pain and active range of motion improve to initiate strengthening and patient able to wean out of wrist splint ?Baseline: Patient wearing a wrist splint starting this date 24/7 off with ADLs and home program.  Patient has 4-6/10 dorsal wrist pain with active range of motion and use.  Cannot tolerate any strengthening ?Goal status: INITIAL ?  ?3.  Right wrist active range of motion and strength in proved with no increase symptoms for patient to push and pull heavy door use and ADLs with no increase symptoms. ?Baseline: Not able to do any resistance or strengthening without pain, showed decreased active range of  motion, 4-6/10 pain on dorsal wrist with use ?Goal status: INITIAL ?  ?  ?ASSESSMENT: ?  ?CLINICAL IMPRESSION: ?Patient presented  at OT evaluation with increased right dominant wrist pain.  Pain 4-6/10 over dorsal

## 2022-03-11 ENCOUNTER — Ambulatory Visit: Payer: 59 | Admitting: Occupational Therapy

## 2022-03-11 DIAGNOSIS — M25531 Pain in right wrist: Secondary | ICD-10-CM | POA: Diagnosis not present

## 2022-03-11 NOTE — Therapy (Signed)
[facility] [address] [place], [place], [address] Phone: [phone]   Fax:  [phone]  Occupational Therapy Treatment  Patient Details  Name: Jeannine BogaMartha R Allensworth MRN: [id number] Date of Birth: 06/10/1976 Referring Provider (OT): DR Landry MellowKubinski   Encounter Date: 03/11/2022   OT End of Session - 03/11/22 0915     Visit Number 9    Number of Visits 12    Date for OT Re-Evaluation 03/28/22    OT Start Time 0833    OT Stop Time 0930    OT Time Calculation (min) 57 min    Activity Tolerance Patient tolerated treatment well    Behavior [facility] tasks assessed/performed             Past Medical History:  Diagnosis Date   Diabetes mellitus without complication (HCC)    Gestational   GERD (gastroesophageal reflux disease)    Heart murmur    Hypothyroidism     Past Surgical History:  Procedure Laterality Date   CESAREAN SECTION N/A 06/07/2016   Procedure: CESAREAN SECTION;  Surgeon: Suzy Bouchardhomas J Schermerhorn, MD;  Location: [facility] ORS;  Service: Obstetrics;  Laterality: N/A;   DIAGNOSTIC LAPAROSCOPY      There were no vitals filed for this visit.   Subjective Assessment - 03/11/22 0914     Subjective  My wrist is good I keep the brace on about 80-85% of the time.  Work life is just very busy at the moment.  It still when I feel at the top of my wrist to my pinky side.    Pertinent History ICD-10-CM   1. 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 The plan is to do the following;    - The patient has chronic right wrist pain of unknown etiology without known injury. She does have recent MRI showing some edema around the dorsal wrist and second/third dorsal  compartment tendons as well as ECU tendinopathy. Her symptoms are under better control today. I explained the possible cause of her symptoms could be wrist joint synovitis versus tendinitis. She has had evaluation by rheumatology. They suspect her ANA is just a normal variation and that she does not have any specific underlying rheumatoid condition, however, she still does have some polyarthralgia and may want to consider second opinion in the future.  - She does not have significant symptoms today. She has some tenderness palpation and decreased range of motion of the wrist but this is not as severe as it normally is to her. I discussed the uncertainty of the exact cause of her symptoms but basic conservative treatments moving forward with activity modification, bracing, home exercise, physical therapy, medication, ice/compression, heat/massage, topical pain cream, steroid injection. Because her symptoms are not too bad at this point, I recommended occupational therapy to see if this can provide her with more symptom relief exercises to better support the wrist.  - Activity as tolerated. Modify as needed according to symptoms. No limitations to pain-free weight bearing but I did recommend using a neutral wrist position. She can use her short arm wrist brace as needed.  - Refer to PT for pain relieving modalities, manual techniques as indicated including  dry needling and/for instrument assisted soft tissue techniques, home exercise planning, iontophoresis. Continue home exercise program to maintain strength, flexibility, and endurance.  - Use Tylenol, anti-inflammatories, topical diclofenac/pain cream, relative rest, elevation, compression, massage, and ice/heat as needed for pain.   - Follow up as needed depending on flare of symptoms. We can expedite reevaluation if she has a significant flare and consider diagnostic/therapeutic injection at that time.    Patient Stated Goals Want my pain better so I can do my  work and do my workouts and play with my daughter    Currently in Pain? No/denies                          OT Treatments/Exercises (OP) - 03/11/22 0001       Iontophoresis   Type of Iontophoresis Dexamethasone    Location Dorsal wrist over ECU and ulnar wrist    Dose med patch , 2.0 current    Time 19      RUE Fluidotherapy   Number Minutes Fluidotherapy 8 Minutes    RUE Fluidotherapy Location Hand;Wrist    Comments AROM for wrist in all planes prior to soft tissue            TODAY'S TREATMENT:  Patient making progress since started  iontophoresis with dexamethasone as well as joint mobilization to distal radius ulnar joint with carpal rolls.  Do soft tissue and joint mobs after fluidotherapy.    Patient with a softer end now for wrist extension where before it was hard and.  Continued to have some tenderness over  ECU .   Reviewed with patient again today active assisted range of motion over edge of table for wrist flexion extension-reinforced for patient to do more moving of elbow over the wrist but then using extensors irritating may be the lateral epicondyle.   And add today wall slides in the shower pain-free to facilitate active assisted passive range of motion to 70 degrees of wrist extension Do some light joint traction at wrist and joint mobilization followed by wrist flexion ,extension and active and active assisted range of motion for wrist flexion and extension with open hand and loose fist afterwards. Showed increase active range of motion to 65 wrist extension and passively 70. Did Grasston over volar and dorsal forearm and wrist Grasston #2 tool and sweeping mostly prior to active assisted range of motion Continue with tightness over volar wrist and forearm   Patient continues to wear wrist immobilization splint 85% of the day -also provided patient with a Benik neoprene wrist wrap to use on computer and keyboard last time. Discussed with patient  extensively in the past about her workstation, keyboard-mouse set up.  Feels like patient had made great changes with that.         Done 6th ionto  session today over ECU [facility] R wrist  -patient tolerating well skin check done afterwards no issues.     Radial and ulnar deviation active assisted range of motion palms together.  And continue supination pronation.  Pain-free  Patient to keep pain under 2/10.      PATIENT EDUCATION: Patient was educated in home program as well as modifications, and splint fitting.  Handout was provided and patient educated and reviewed on home program able to verbalize and demo understanding HOME EXERCISE PROGRAM: See treatment note GOALS: Goals reviewed with patient? Yes   SHORT TERM GOALS: Target date: 02/21/2022   Patient to be independent  in home program of wearing a splint to show increased active range of motion with pain less than 2/10 Baseline: Pain in session 4/10; this past week pain was 5-6/10 at dorsal wrist patient so decreased wrist extension and flexion Goal status: INITIAL     LONG TERM GOALS: Target date: 03/28/2022   Patient's right wrist active range of motion increase to within normal limits with out increased symptoms in all planes Baseline: Wrist pain mostly on dorsal wrist 4-6/10 with use this past week and in session with decreased wrist extension and flexion Goal status: INITIAL   2.  Right wrist pain and active range of motion improve to initiate strengthening and patient able to wean out of wrist splint Baseline: Patient wearing a wrist splint starting this date 24/7 off with ADLs and home program.  Patient has 4-6/10 dorsal wrist pain with active range of motion and use.  Cannot tolerate any strengthening Goal status: INITIAL   3.  Right wrist active range of motion and strength in proved with no increase symptoms for patient to push and pull heavy door use and ADLs with no increase symptoms. Baseline: Not able to do any  resistance or strengthening without pain, showed decreased active range of motion, 4-6/10 pain on dorsal wrist with use Goal status: INITIAL     ASSESSMENT:   CLINICAL IMPRESSION: Patient presented  at OT evaluation with increased right dominant wrist pain.  Pain 4-6/10 over dorsal wrist most of the time with repetitive use and unable to do weightbearing.  Patient had an MRI that showed ECU tendinoplasty as well as 2nd and 3rd extensor intersection syndrome at right wrist.  Patient report having her symptoms for about a year but it increased gradually and since February could not sleep at times.  Patient do show decreased wrist extension more than flexion with increased pain mostly at ECU.  Now patient is showing great progress since starting ionto with dexamethasone last week patient on 6th session today.  As well as joint mobilization with traction and mobilization.  Was able to also add passive range of motion/active assisted range of motion for wrist flexion extension over the edge of table with progress since last week to 65 degrees in session for wrist extension, passive range of motion 70.  ADd this this date wall slides but no pressure thru palm.  Still feeling mostly tight in wrist extension actively and pull over ECU.  Reinforced with patient again to do active assisted and passive range of motion not to strain lateral epicondyle.  Patient tolerating ionto well.  Patient did make changes to her computer set up including mouse and new desk for home office.  Patient still working out and reinforce reminded patient to not irritate wrist during her workouts.  Patient limited in right dominant hand functional use by pain decreased range of motion decreased strength in ADLs and IADLs.  Patient can benefit from OT services to decrease pain with increased active range of motion and strength PERFORMANCE DEFICITS in functional skills including ADLs, IADLs, ROM, strength, pain, flexibility, and UE functional  use, uding    IMPAIRMENTS are limiting patient from ADLs, IADLs, work, play, leisure, and social participation.    COMORBIDITIES has no other co-morbidities that affects occupational performance. Patient will benefit from skilled OT to address above impairments and improve overall function.   MODIFICATION OR ASSISTANCE TO COMPLETE EVALUATION: No modification of tasks or assist necessary to complete an evaluation.   OT OCCUPATIONAL PROFILE AND HISTORY: Problem  focused assessment: Including review of records relating to presenting problem.   CLINICAL DECISION MAKING: LOW - limited treatment options, no task modification necessary   REHAB POTENTIAL: Good   EVALUATION COMPLEXITY: Low         PLAN: OT FREQUENCY: 1-2x/week   OT DURATION: 8 weeks   PLANNED INTERVENTIONS: self care/ADL training, therapeutic exercise, manual therapy, passive range of motion, splinting, ultrasound, iontophoresis, fluidotherapy, contrast bath, patient/family education, and DME and/or AE instructions       CONSULTED AND AGREED WITH PLAN OF CARE: Patient   PLAN FOR NEXT SESSION: Assess progress in wrist extension as well as pain and with changes to computer set up with mouse.                     OT Education - 03/11/22 0915     Education Details Progress and changes to home program    Person(s) Educated Patient    Methods Explanation;Demonstration;Verbal cues;Handout;Tactile cues    Comprehension Verbalized understanding;Returned demonstration;Tactile cues required;Verbal cues required                         Patient will benefit from skilled therapeutic intervention in order to improve the following deficits and impairments:           Visit Diagnosis: Pain in right wrist    Problem List Patient Active Problem List   Diagnosis Date Noted   Post-operative state 06/07/2016    Oletta Cohn, OTR/L,CLT 03/11/2022, 9:17 AM   [facility] [address] [place], [address] Phone: [phone]   Fax:  [phone]  Name: JASMAINE ROCHEL MRN: [id number] Date of Birth: Aug 29, 1976

## 2022-03-15 ENCOUNTER — Encounter: Payer: 59 | Admitting: Occupational Therapy

## 2022-03-15 ENCOUNTER — Ambulatory Visit: Payer: 59 | Admitting: Occupational Therapy

## 2022-03-15 DIAGNOSIS — M25531 Pain in right wrist: Secondary | ICD-10-CM | POA: Diagnosis not present

## 2022-03-15 DIAGNOSIS — M25631 Stiffness of right wrist, not elsewhere classified: Secondary | ICD-10-CM

## 2022-03-15 NOTE — Therapy (Signed)
[place] PH[facility]ICAL AND SPORTS MEDICINE [address], [place], [address] Phone: [phone]   Fax:  [phone]  Occupational Therapy Treatment/ PN 10th visit  Patient Details  Name: OLUKEMI PANCHAL MRN: [id number] Date of Birth: 10-23-1976 Referring Provider (OT): DR Candelaria Stagers   Encounter Date: 03/15/2022   OT End of Session - 03/15/22 1045     Visit Number 10    Number of Visits 12    Date for OT Re-Evaluation 03/28/22    OT Start Time 0903    OT Stop Time 0954    OT Time Calculation (min) 51 min    Activity Tolerance Patient tolerated treatment well    Behavior During Therapy [facility] for tasks assessed/performed             Past Medical History:  Diagnosis Date   Diabetes mellitus without complication ([place])    Gestational   GERD (gastroesophageal reflux disease)    Heart murmur    Hypothyroidism     Past Surgical History:  Procedure Laterality Date   CESAREAN SECTION N/A 06/07/2016   Procedure: CESAREAN SECTION;  Surgeon: Boykin Nearing, MD;  Location: [facility] ORS;  Service: Obstetrics;  Laterality: N/A;   DIAGNOSTIC LAPAROSCOPY      There were no vitals filed for this visit.   Subjective Assessment - 03/15/22 1043     Pertinent History ICD-10-CM   1. 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 The plan is to do the following;    - The patient has chronic right wrist pain of unknown etiology without known injury. She does have recent MRI showing some edema around the dorsal wrist and second/third dorsal compartment tendons as well as ECU tendinopathy. Her symptoms are under better control today. I explained the possible cause of her symptoms could be wrist joint synovitis  versus tendinitis. She has had evaluation by rheumatology. They suspect her ANA is just a normal variation and that she does not have any specific underlying rheumatoid condition, however, she still does have some polyarthralgia and may want to consider second opinion in the future.  - She does not have significant symptoms today. She has some tenderness palpation and decreased range of motion of the wrist but this is not as severe as it normally is to her. I discussed the uncertainty of the exact cause of her symptoms but basic conservative treatments moving forward with activity modification, bracing, home exercise, physical therapy, medication, ice/compression, heat/massage, topical pain cream, steroid injection. Because her symptoms are not too bad at this point, I recommended occupational therapy to see if this can provide her with more symptom relief exercises to better support the wrist.  - Activity as tolerated. Modify as needed according to symptoms. No limitations to pain-free weight bearing but I did recommend using a neutral wrist position. She can use her short arm wrist brace as needed.  - Refer to PT for pain relieving modalities, manual techniques as indicated including dry needling and/for instrument assisted soft tissue techniques, home exercise planning, iontophoresis. Continue home exercise program to maintain strength, flexibility, and endurance.  - Use Tylenol, anti-inflammatories, topical diclofenac/pain cream, relative rest, elevation, compression, massage, and ice/heat as needed for pain.   -  Follow up as needed depending on flare of symptoms. We can expedite reevaluation if she has a significant flare and consider diagnostic/therapeutic injection at that time.    Patient Stated Goals Want my pain better so I can do my work and do my workouts and play with my daughter    Currently in Pain? Yes    Pain Score --   Number stated over TFCC wrist   Pain Location Wrist    Pain Orientation  Right                          OT Treatments/Exercises (OP) - 03/15/22 0001       Iontophoresis   Type of Iontophoresis Dexamethasone    Location Right ulnar wrist at TFCC    Dose med patch , 2.0 current    Time 19      RUE Paraffin   Number Minutes Paraffin 8 Minutes    RUE Paraffin Location Wrist    Comments Prior to range of motion decreased pain and stiffness             TODAY'S TREATMENT:  Patient patient made great progress from start of care with patient in for-6/10 at evaluation with second and third digit extension as well as dorsal wrist over ECU.  AROM improved greatly to within normal limits except extension continues to be limited to about 60 degrees.  Patient did make progress in wrist extension in the last 2 weeks since ionto with dexamethasone was started.  But the last 2 sessions patient continues to have some tenderness and pain over TFCC area with palpation and wrist extension.  Recommend at this time for patient to contact Dr. Candelaria Stagers for possible ultrasound and shot.   Patient continues to wear wrist braces during the day most of the time.    Feels that patient continues to have a heart and at wrist extension and pain limiting as with passive range of motion  and active assisted range of motion.  A   Showed increase active range of motion to 65 wrist extension but pain this date past 65 degrees. Did Grasston over volar and dorsal forearm and wrist Grasston #2 tool and sweeping mostly prior to active assisted range of motion Continue with tightness over volar wrist and forearm with tenderness and pain over TFCC on ulnar wrist.   Patient continues to wear wrist immobilization splint 85% of the day -also provided patient with a Benik neoprene wrist wrap to use on computer and keyboard last time. Patient made great changes to her computer set up at home including desk, keyboard monitors and mouse.  With great success in decreasing her pain in  wrist and fingers.     Done 7 th ionto  session today over TFCC R wrist  -patient tolerating well skin check done afterwards no issues.   Recommend for patient to contact Dr. Candelaria Stagers for ultrasound and shot.   PATIENT EDUCATION: Patient was educated in home program as well as modifications, and splint fitting.  Handout was provided and patient educated and reviewed on home program able to verbalize and demo understanding HOME EXERCISE PROGRAM: See treatment note GOALS: Goals reviewed with patient? Yes   SHORT TERM GOALS: Target date: 02/21/2022   Patient to be independent in home program of wearing a splint to show increased active range of motion with pain less than 2/10 Baseline: Pain in session 4/10; this past week pain was 5-6/10 at dorsal wrist  patient so decreased wrist extension and flexion  NOW active range of motion within normal limits except wrist extension 60 degrees and pain no pain with ulnar radial deviation wrist flexion or supination pronation. Goal status: Met  LONG TERM GOALS: Target date: 03/28/2022   Patient's right wrist active range of motion increase to within normal limits with out increased symptoms in all planes Baseline: Wrist pain mostly on dorsal wrist 4-6/10 with use this past week and in session with decreased wrist extension and flexion NOW active range of motion within normal limits except wrist extension and 60 and pain at TFCC Goal status: Progressing  2.  Right wrist pain and active range of motion improve to initiate strengthening and patient able to wean out of wrist splint Baseline: Patient wearing a wrist splint starting this date 24/7 off with ADLs and home program.  Patient has 4-6/10 dorsal wrist pain with active range of motion and use.  NOW  Cannot tolerate any strengthening because of wrist extension still limited in pain over TFCC with wrist extension Goal status: Progressing   3.  Right wrist active range of motion and strength in proved with  no increase symptoms for patient to push and pull heavy door use and ADLs with no increase symptoms. Baseline: Not able to do any resistance or strengthening without pain, showed decreased active range of motion, 4-6/10 pain on dorsal wrist with use NOW wrist extension still limited in pain with wrist extension at 60 to 65 degrees TFCC Goal status: Progressing     ASSESSMENT:   CLINICAL IMPRESSION: Patient presented  at OT evaluation with increased right dominant wrist pain.  Pain 4-6/10 over dorsal wrist most of the time with repetitive use and unable to do weightbearing.  Patient had an MRI that showed ECU tendinoplasty as well as 2nd and 3rd extensor intersection syndrome at right wrist.  Patient report having her symptoms for about a year but it increased gradually and since February could not sleep at times.  Patient made great progress in active range of motion for right wrist and digits without pain.  Except wrist extension continues to be limited at 60 degrees with pain over TFCC as well as tenderness.  ECU pain as well as second and third digit extension pain improved greatly.  Patient made changes to her workstation at home including desk and mouse, keyboard with decreased pain in right wrist.  Patient continues to wear a brace because of wrist extension and pain over TFCC.  Patient had seventh session of ionto today discussed with patient to contact Dr. Candelaria Stagers for possible ultrasound and shot in the TFCC or ulnar wrist area.  Patient limited in right dominant hand functional use by pain decreased range of motion decreased strength in ADLs and IADLs.  Patient can benefit from OT services to decrease pain with increased active range of motion and strength PERFORMANCE DEFICITS in functional skills including ADLs, IADLs, ROM, strength, pain, flexibility, and UE functional use, uding    IMPAIRMENTS are limiting patient from ADLs, IADLs, work, play, leisure, and social participation.     COMORBIDITIES has no other co-morbidities that affects occupational performance. Patient will benefit from skilled OT to address above impairments and improve overall function.   MODIFICATION OR ASSISTANCE TO COMPLETE EVALUATION: No modification of tasks or assist necessary to complete an evaluation.   OT OCCUPATIONAL PROFILE AND HISTORY: Problem focused assessment: Including review of records relating to presenting problem.   CLINICAL DECISION MAKING: LOW - limited treatment  options, no task modification necessary   REHAB POTENTIAL: Good   EVALUATION COMPLEXITY: Low         PLAN: OT FREQUENCY: 1-2x/week   OT DURATION: 8 weeks   PLANNED INTERVENTIONS: self care/ADL training, therapeutic exercise, manual therapy, passive range of motion, splinting, ultrasound, iontophoresis, fluidotherapy, contrast bath, patient/family education, and DME and/or AE instructions       CONSULTED AND AGREED WITH PLAN OF CARE: Patient   PLAN FOR NEXT SESSION: Assess progress in wrist extension as well as pain and with changes to computer set up with mouse.                               OT Education - 03/15/22 1045     Education Details Progress and changes to home program    Person(s) Educated Patient    Methods Explanation;Demonstration;Verbal cues;Handout;Tactile cues    Comprehension Verbalized understanding;Returned demonstration;Tactile cues required;Verbal cues required                         Patient will benefit from skilled therapeutic intervention in order to improve the following deficits and impairments:           Visit Diagnosis: Pain in right wrist  Stiffness of right wrist, not elsewhere classified    Problem List Patient Active Problem List   Diagnosis Date Noted   Post-operative state 06/07/2016    Rosalyn Gess, OTR/L,CLT 03/15/2022, 10:56 AM  [place] [facility] [address], [place], [address] Phone: [phone]   Fax:  [phone]  Name: KERIANN RANKIN MRN: [id number] Date of Birth: 03/15/1976

## 2022-03-17 ENCOUNTER — Ambulatory Visit: Payer: 59 | Admitting: Occupational Therapy

## 2022-03-17 DIAGNOSIS — M25531 Pain in right wrist: Secondary | ICD-10-CM | POA: Diagnosis not present

## 2022-03-17 DIAGNOSIS — M25631 Stiffness of right wrist, not elsewhere classified: Secondary | ICD-10-CM

## 2022-03-17 NOTE — Therapy (Signed)
[facility] [address], [place], [address] Phone: [phone]   Fax:  [phone]  Occupational Therapy Treatment  Patient Details  Name: Tonya Woodward MRN: [id number] Date of Birth: Dec 04, 1975 Referring Provider (OT): DR Candelaria Stagers   Encounter Date: 03/17/2022   OT End of Session - 03/17/22 1516     Visit Number 11    Number of Visits 12    Date for OT Re-Evaluation 03/28/22    OT Start Time 1315    OT Stop Time 1407    OT Time Calculation (min) 52 min    Activity Tolerance Patient tolerated treatment well    Behavior During Therapy [facility] for tasks assessed/performed             Past Medical History:  Diagnosis Date   Diabetes mellitus without complication ([place])    Gestational   GERD (gastroesophageal reflux disease)    Heart murmur    Hypothyroidism     Past Surgical History:  Procedure Laterality Date   CESAREAN SECTION N/A 06/07/2016   Procedure: CESAREAN SECTION;  Surgeon: Boykin Nearing, MD;  Location: [facility] ORS;  Service: Obstetrics;  Laterality: N/A;   DIAGNOSTIC LAPAROSCOPY      There were no vitals filed for this visit.   Subjective Assessment - 03/17/22 1335     Subjective  That 1 side of my wrist is starting to ache and really is not on the top of the wrist anymore like it used to be is still 1 area when I bend back my wrist and when I go sideways it even aches just in my brace.  I called Dr. Pauline Aus office but he is on vacation next week so I cannot get appointment till 7 June.    Pertinent History ICD-10-CM   1. 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 The plan is to do the following;    - The patient has chronic right  wrist pain of unknown etiology without known injury. She does have recent MRI showing some edema around the dorsal wrist and second/third dorsal compartment tendons as well as ECU tendinopathy. Her symptoms are under better control today. I explained the possible cause of her symptoms could be wrist joint synovitis versus tendinitis. She has had evaluation by rheumatology. They suspect her ANA is just a normal variation and that she does not have any specific underlying rheumatoid condition, however, she still does have some polyarthralgia and may want to consider second opinion in the future.  - She does not have significant symptoms today. She has some tenderness palpation and decreased range of motion of the wrist but this is not as severe as it normally is to her. I discussed the uncertainty of the exact cause of her symptoms but basic conservative treatments moving forward with activity modification, bracing, home exercise, physical therapy, medication, ice/compression, heat/massage, topical pain cream, steroid injection. Because her symptoms are not too bad at this point, I recommended occupational therapy to see if this can provide her with more symptom relief exercises to better support the wrist.  - Activity as tolerated. Modify as needed according to symptoms. No limitations to pain-free weight bearing but I did recommend  using a neutral wrist position. She can use her short arm wrist brace as needed.  - Refer to PT for pain relieving modalities, manual techniques as indicated including dry needling and/for instrument assisted soft tissue techniques, home exercise planning, iontophoresis. Continue home exercise program to maintain strength, flexibility, and endurance.  - Use Tylenol, anti-inflammatories, topical diclofenac/pain cream, relative rest, elevation, compression, massage, and ice/heat as needed for pain.   - Follow up as needed depending on flare of symptoms. We can expedite reevaluation if she  has a significant flare and consider diagnostic/therapeutic injection at that time.    Patient Stated Goals Want my pain better so I can do my work and do my workouts and play with my daughter    Currently in Pain? No/denies    Pain Score 4     Pain Location Wrist    Pain Orientation Right    Pain Descriptors / Indicators Tender    Pain Type Acute pain    Pain Onset More than a month ago    Pain Frequency Intermittent                          OT Treatments/Exercises (OP) - 03/17/22 0001       Iontophoresis   Type of Iontophoresis Dexamethasone    Location Right ulnar wrist at TFCC    Dose med patch , 1.6 current    Time 22      RUE Fluidotherapy   Number Minutes Fluidotherapy 8 Minutes    RUE Fluidotherapy Location Hand;Wrist    Comments AROM in the pain-free range               TODAY'S TREATMENT:  Patient patient made great progress from start of care with patient in for-6/10 at evaluation with second and third digit extension as well as dorsal wrist over ECU.  AROM improved greatly to within normal limits except extension continues to be limited to about  55-60 degrees.  Patient did make progress in wrist extension in the last 2-3 weeks since ionto with dexamethasone was started.  But the last 3 sessions patient continues to have some tenderness and pain over TFCC area with palpation and wrist extension/ UD.  Recommend at this time for patient to contact Dr. Candelaria Stagers for possible ultrasound and shot.   Patient reports she has appointment on 7 June with Dr. Candelaria Stagers. Patient continues to wear wrist braces during the day most of the time.     Patient reports some achiness in her splints even over the ulnar distal wrist.  At this time recommend for patient to do contrast wearing her wrist brace most of the time and hold off on any passive motion or active assisted range of motion for wrist.  To only do active range of motion after contrast.   She do have some  Voltaren ointment that she can put on at night time in the morning over the TFCC area until seeing Dr. Candelaria Stagers.    Feel at this time to hold off next week on appointments until she sees Dr. Candelaria Stagers on the seventh.  Patient will follow-up with me a day or 2 before that to get a new basement measurements and see how she doing with the range of motion pain and strength.     Did [place] over volar and dorsal forearm and wrist [place] #2 tool and sweeping mostly prior to active assisted range of motion Continue with tightness over volar wrist  and forearm with tenderness and pain over TFCC on ulnar wrist.   Done eighth session of ionto today.    PATIENT EDUCATION: Patient was educated in home program as well as modifications, and splint fitting.  Handout was provided and patient educated and reviewed on home program able to verbalize and demo understanding HOME EXERCISE PROGRAM: See treatment note GOALS: Goals reviewed with patient? Yes   SHORT TERM GOALS: Target date: 02/21/2022   Patient to be independent in home program of wearing a splint to show increased active range of motion with pain less than 2/10 Baseline: Pain in session 4/10; this past week pain was 5-6/10 at dorsal wrist patient so decreased wrist extension and flexion  NOW active range of motion within normal limits except wrist extension 60 degrees and pain no pain with ulnar radial deviation wrist flexion or supination pronation. Goal status: Met  LONG TERM GOALS: Target date: 03/28/2022   Patient's right wrist active range of motion increase to within normal limits with out increased symptoms in all planes Baseline: Wrist pain mostly on dorsal wrist 4-6/10 with use this past week and in session with decreased wrist extension and flexion NOW active range of motion within normal limits except wrist extension and 60 and pain at TFCC Goal status: Progressing   2.  Right wrist pain and active range of motion improve to initiate  strengthening and patient able to wean out of wrist splint Baseline: Patient wearing a wrist splint starting this date 24/7 off with ADLs and home program.  Patient has 4-6/10 dorsal wrist pain with active range of motion and use.  NOW  Cannot tolerate any strengthening because of wrist extension still limited in pain over TFCC with wrist extension Goal status: Progressing   3.  Right wrist active range of motion and strength in proved with no increase symptoms for patient to push and pull heavy door use and ADLs with no increase symptoms. Baseline: Not able to do any resistance or strengthening without pain, showed decreased active range of motion, 4-6/10 pain on dorsal wrist with use NOW wrist extension still limited in pain with wrist extension at 60 to 65 degrees TFCC Goal status: Progressing     ASSESSMENT:   CLINICAL IMPRESSION: Patient presented  at OT evaluation with increased right dominant wrist pain.  Pain 4-6/10 over dorsal wrist most of the time with repetitive use and unable to do weightbearing.  Patient had an MRI that showed ECU tendinoplasty as well as 2nd and 3rd extensor intersection syndrome at right wrist.  Patient report having her symptoms for about a year but it increased gradually and since February could not sleep at times.  Patient made great progress in active range of motion for right wrist and digits without pain.  Except wrist extension continues to be limited at 55-60 degrees with pain over TFCC as well as tenderness.  ECU pain as well as second and third digit extension pain improved greatly.  Patient made changes to her workstation at home including desk and mouse, keyboard with decreased pain in right wrist.  Patient continues to wear a brace because of wrist extension and pain over TFCC.  Patient had her eighth session of ionto with the last 2 over the TFCC ligament.  Patient to hold off on passive or active assisted range of motion for wrist doing any pain-free active  range of motion.  Patient has appointment with Dr. Candelaria Stagers on 7 June for possible ultrasound and shot in the TFCC  or ulnar wrist area.  Patient limited in right dominant hand functional use by pain decreased range of motion decreased strength in ADLs and IADLs.  Patient can benefit from OT services to decrease pain with increased active range of motion and strength PERFORMANCE DEFICITS in functional skills including ADLs, IADLs, ROM, strength, pain, flexibility, and UE functional use, uding    IMPAIRMENTS are limiting patient from ADLs, IADLs, work, play, leisure, and social participation.    COMORBIDITIES has no other co-morbidities that affects occupational performance. Patient will benefit from skilled OT to address above impairments and improve overall function.   MODIFICATION OR ASSISTANCE TO COMPLETE EVALUATION: No modification of tasks or assist necessary to complete an evaluation.   OT OCCUPATIONAL PROFILE AND HISTORY: Problem focused assessment: Including review of records relating to presenting problem.   CLINICAL DECISION MAKING: LOW - limited treatment options, no task modification necessary   REHAB POTENTIAL: Good   EVALUATION COMPLEXITY: Low         PLAN: OT FREQUENCY: 1-2x/week   OT DURATION: 8 weeks   PLANNED INTERVENTIONS: self care/ADL training, therapeutic exercise, manual therapy, passive range of motion, splinting, ultrasound, iontophoresis, fluidotherapy, contrast bath, patient/family education, and DME and/or AE instructions       CONSULTED AND AGREED WITH PLAN OF CARE: Patient   PLAN FOR NEXT SESSION: Assess progress in wrist extension as well as pain and with changes to computer set up with mouse.                OT Education - 03/17/22 1516     Education Details Progress and changes to home program    Person(s) Educated Patient    Methods Explanation;Demonstration;Verbal cues;Handout;Tactile cues    Comprehension Verbalized understanding;Returned  demonstration;Tactile cues required;Verbal cues required                         Patient will benefit from skilled therapeutic intervention in order to improve the following deficits and impairments:           Visit Diagnosis: Pain in right wrist  Stiffness of right wrist, not elsewhere classified    Problem List Patient Active Problem List   Diagnosis Date Noted   Post-operative state 06/07/2016    Rosalyn Gess, OTR/L,CLT 03/17/2022, 3:17 PM  [facility] [address] [place], [address] Phone: [phone]   Fax:  [phone]  Name: Tonya Woodward MRN: [id number] Date of Birth: [birth year]

## 2022-03-18 ENCOUNTER — Encounter: Payer: 59 | Admitting: Occupational Therapy

## 2022-03-18 ENCOUNTER — Encounter: Payer: Self-pay | Admitting: Occupational Therapy

## 2022-03-22 ENCOUNTER — Encounter: Payer: Self-pay | Admitting: Occupational Therapy

## 2022-03-22 ENCOUNTER — Ambulatory Visit: Payer: 59 | Admitting: Occupational Therapy

## 2022-03-22 DIAGNOSIS — M25531 Pain in right wrist: Secondary | ICD-10-CM

## 2022-03-22 DIAGNOSIS — M25631 Stiffness of right wrist, not elsewhere classified: Secondary | ICD-10-CM

## 2022-03-22 NOTE — Therapy (Signed)
[facility] [address], [place], [address] Phone: [phone]   Fax:  [phone]  Occupational Therapy Treatment  Patient Details  Name: Tonya Woodward MRN: [id number] Date of Birth: 05-11-1976 Referring Provider (OT): DR Candelaria Stagers   Encounter Date: 03/22/2022   OT End of Session - 03/22/22 1014     Visit Number 12    Number of Visits 12    Date for OT Re-Evaluation 03/28/22    OT Start Time 0948    OT Stop Time 1013    OT Time Calculation (min) 25 min    Activity Tolerance Patient tolerated treatment well    Behavior During Therapy [facility] for tasks assessed/performed             Past Medical History:  Diagnosis Date   Diabetes mellitus without complication ([place])    Gestational   GERD (gastroesophageal reflux disease)    Heart murmur    Hypothyroidism     Past Surgical History:  Procedure Laterality Date   CESAREAN SECTION N/A 06/07/2016   Procedure: CESAREAN SECTION;  Surgeon: Boykin Nearing, MD;  Location: [facility] ORS;  Service: Obstetrics;  Laterality: N/A;   DIAGNOSTIC LAPAROSCOPY      There were no vitals filed for this visit.   Subjective Assessment - 03/22/22 1013     Subjective  Thank you for seeing me I missed you for this week and I am sorry.  But my wrist was just hurting Friday into Saturday.  But I wore my brace and he feels really good hot water but I do not want to wait until the 7th June and see Dr. Candelaria Stagers.    Pertinent History ICD-10-CM   1. 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 The plan is to do the following;    - The patient has chronic right wrist pain of unknown etiology without known injury. She does have  recent MRI showing some edema around the dorsal wrist and second/third dorsal compartment tendons as well as ECU tendinopathy. Her symptoms are under better control today. I explained the possible cause of her symptoms could be wrist joint synovitis versus tendinitis. She has had evaluation by rheumatology. They suspect her ANA is just a normal variation and that she does not have any specific underlying rheumatoid condition, however, she still does have some polyarthralgia and may want to consider second opinion in the future.  - She does not have significant symptoms today. She has some tenderness palpation and decreased range of motion of the wrist but this is not as severe as it normally is to her. I discussed the uncertainty of the exact cause of her symptoms but basic conservative treatments moving forward with activity modification, bracing, home exercise, physical therapy, medication, ice/compression, heat/massage, topical pain cream, steroid injection. Because her symptoms are not too bad at this point, I recommended occupational therapy to see if this can provide her with more symptom relief exercises to better support the wrist.  - Activity as tolerated. Modify as needed according to symptoms. No limitations to pain-free weight bearing but I did recommend using a neutral wrist position. She can use her short arm wrist brace as needed.  -  Refer to PT for pain relieving modalities, manual techniques as indicated including dry needling and/for instrument assisted soft tissue techniques, home exercise planning, iontophoresis. Continue home exercise program to maintain strength, flexibility, and endurance.  - Use Tylenol, anti-inflammatories, topical diclofenac/pain cream, relative rest, elevation, compression, massage, and ice/heat as needed for pain.   - Follow up as needed depending on flare of symptoms. We can expedite reevaluation if she has a significant flare and consider diagnostic/therapeutic  injection at that time.    Patient Stated Goals Want my pain better so I can do my work and do my workouts and play with my daughter    Currently in Pain? No/denies                [facility] OT Assessment - 03/22/22 0001       AROM   Right Wrist Extension 55 Degrees   pain sharp ulnar wrist ? TFCC   Right Wrist Flexion 90 Degrees    Right Wrist Radial Deviation 30 Degrees    Right Wrist Ulnar Deviation 30 Degrees                      OT Treatments/Exercises (OP) - 03/22/22 0001       RUE Contrast Bath   Time 8 minutes    Comments Decreased pain in wrist             TODAY'S TREATMENT:  Patient messaged this OT over the weekend with reports of increased pain over distal radius at?  TFCC area.  Did not want to wait until appointment on the seventh with Dr. Candelaria Stagers to be seen.   Patient continues to show progress compared to at start of care -pain at evaluation was 6/10 at second and third digit extension as well as dorsal wrist over ECU.  AROM improved greatly to within normal limits except extension continues to be limited to about  55-60 degrees.  Patient made great progress until about 2-3 sessions ago wear wrist extension -she has pain at ulnar wrist,?  TFCC.  Even with ionto and modalities not able to get patient past 55-60 degrees of wrist extension without pain.  Recommended last time for patient to get appointment with Dr. Erma Pinto appointment 7 June with Dr. Candelaria Stagers. Patient continues to wear wrist braces during the day most of the time.     Patient this date reports pain got better the last 2 days with wearing the wrist brace as well as hot water in the shower.  Patient is active range of motion for ulnar radial deviation was pain-free today as well as flexion and within normal limits.  Wrist extension continues to be painful at 55-60 degrees.  With pain not at ECU anymore but mostly over?  TFCC.  Patient continues until appointment with Dr. Candelaria Stagers with  wearing the wrist splints, doing contrast or heat and maintaining pain-free active range of motion.    Held off on ionto today.  PATIENT EDUCATION: Patient was educated in home program as well as modifications, and splint fitting.  Handout was provided and patient educated and reviewed on home program able to verbalize and demo understanding HOME EXERCISE PROGRAM: See treatment note GOALS: Goals reviewed with patient? Yes   SHORT TERM GOALS: Target date: 02/21/2022   Patient to be independent in home program of wearing a splint to show increased active range of motion with pain less than 2/10 Baseline: Pain in session 4/10; this past week pain was 5-6/10 at  dorsal wrist patient so decreased wrist extension and flexion  NOW active range of motion within normal limits except wrist extension 60 degrees and pain no pain with ulnar radial deviation wrist flexion or supination pronation. Goal status: Met  LONG TERM GOALS: Target date: 03/28/2022   Patient's right wrist active range of motion increase to within normal limits with out increased symptoms in all planes Baseline: Wrist pain mostly on dorsal wrist 4-6/10 with use this past week and in session with decreased wrist extension and flexion NOW active range of motion within normal limits except wrist extension and 60 and pain at TFCC Goal status: Progressing   2.  Right wrist pain and active range of motion improve to initiate strengthening and patient able to wean out of wrist splint Baseline: Patient wearing a wrist splint starting this date 24/7 off with ADLs and home program.  Patient has 4-6/10 dorsal wrist pain with active range of motion and use.  NOW  Cannot tolerate any strengthening because of wrist extension still limited in pain over TFCC with wrist extension Goal status: Progressing   3.  Right wrist active range of motion and strength in proved with no increase symptoms for patient to push and pull heavy door use and ADLs with no  increase symptoms. Baseline: Not able to do any resistance or strengthening without pain, showed decreased active range of motion, 4-6/10 pain on dorsal wrist with use NOW wrist extension still limited in pain with wrist extension at 60 to 65 degrees TFCC Goal status: Progressing     ASSESSMENT:   CLINICAL IMPRESSION: Patient arrived with pain-free wrist in wrist brace the last 2 days.  Patient continues to show improvement from start of care but continued to have pain with wrist extension at 55-60 degrees.  Pain in the last 2 sessions mostly over?  TFCC and not ECU.  Patient was seen today after messaging this OT that she had increased pain last Thursday Friday-constant pain.  Held off this day with iontophoresis-done contrast (range of motion.  Patient continues to wear her wrist brace for the next week as well as pain-free active range of motion after contrast or heat.  Has appointment with Dr. Candelaria Stagers on 7 June.  Patient limited in right dominant hand functional use by pain decreased range of motion decreased strength in ADLs and IADLs.  Patient can benefit from OT services to decrease pain with increased active range of motion and strength PERFORMANCE DEFICITS in functional skills including ADLs, IADLs, ROM, strength, pain, flexibility, and UE functional use, uding    IMPAIRMENTS are limiting patient from ADLs, IADLs, work, play, leisure, and social participation.    COMORBIDITIES has no other co-morbidities that affects occupational performance. Patient will benefit from skilled OT to address above impairments and improve overall function.   MODIFICATION OR ASSISTANCE TO COMPLETE EVALUATION: No modification of tasks or assist necessary to complete an evaluation.   OT OCCUPATIONAL PROFILE AND HISTORY: Problem focused assessment: Including review of records relating to presenting problem.   CLINICAL DECISION MAKING: LOW - limited treatment options, no task modification necessary   REHAB  POTENTIAL: Good   EVALUATION COMPLEXITY: Low         PLAN: OT FREQUENCY: 1-2x/week   OT DURATION: 8 weeks   PLANNED INTERVENTIONS: self care/ADL training, therapeutic exercise, manual therapy, passive range of motion, splinting, ultrasound, iontophoresis, fluidotherapy, contrast bath, patient/family education, and DME and/or AE instructions       CONSULTED AND AGREED WITH PLAN OF  CARE: Patient   PLAN FOR NEXT SESSION: Assess active range of motion and if continued pain over TFCC prior to appointment on the seventh with Dr. Orion Modest                        OT Education - 03/22/22 1014     Education Details Progress and changes to home program    Person(s) Educated Patient    Methods Explanation;Demonstration;Verbal cues;Handout;Tactile cues    Comprehension Verbalized understanding;Returned demonstration;Tactile cues required;Verbal cues required                         Patient will benefit from skilled therapeutic intervention in order to improve the following deficits and impairments:           Visit Diagnosis: Pain in right wrist  Stiffness of right wrist, not elsewhere classified    Problem List Patient Active Problem List   Diagnosis Date Noted   Post-operative state 06/07/2016    Rosalyn Gess, OTR/L,CLT 03/22/2022, 10:17 AM  [facility] [address], [place], [address] Phone: [phone]   Fax:  [phone]  Name: Tonya Woodward MRN: [id number] Date of Birth: 07-25-1976

## 2022-03-29 ENCOUNTER — Ambulatory Visit: Payer: 59 | Attending: Sports Medicine | Admitting: Occupational Therapy

## 2022-03-29 DIAGNOSIS — M25631 Stiffness of right wrist, not elsewhere classified: Secondary | ICD-10-CM | POA: Diagnosis present

## 2022-03-29 DIAGNOSIS — M25531 Pain in right wrist: Secondary | ICD-10-CM | POA: Diagnosis present

## 2022-03-29 NOTE — Therapy (Signed)
[place] [facility] [address], [place], [address] Phone: [phone]   Fax:  [phone]  Occupational Therapy Treatment  Patient Details  Name: Tonya Woodward MRN: [id number] Date of Birth: Nov 01, 1975 Referring Provider (OT): DR Landry Mellow   Encounter Date: 03/29/2022   OT End of Session - 03/29/22 0859     Visit Number 13    Number of Visits 19    Date for OT Re-Evaluation 05/10/22    OT Start Time 0900    OT Stop Time 0936    OT Time Calculation (min) 36 min    Activity Tolerance Patient tolerated treatment well    Behavior During Therapy [facility] for tasks assessed/performed             Past Medical History:  Diagnosis Date   Diabetes mellitus without complication (HCC)    Gestational   GERD (gastroesophageal reflux disease)    Heart murmur    Hypothyroidism     Past Surgical History:  Procedure Laterality Date   CESAREAN SECTION N/A 06/07/2016   Procedure: CESAREAN SECTION;  Surgeon: Suzy Bouchard, MD;  Location: [facility] ORS;  Service: Obstetrics;  Laterality: N/A;   DIAGNOSTIC LAPAROSCOPY      There were no vitals filed for this visit.   Subjective Assessment - 03/29/22 0859     Subjective  I have been wearing the brace and keeping my wrist straight when I work out in the gym and it is not hurting at the moment-but unknown compensating and favoring    Pertinent History ICD-10-CM   1. 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 The plan is to do the following;    - The patient has chronic right wrist pain of unknown etiology without known injury. She does have recent MRI showing some edema around the dorsal wrist and second/third dorsal  compartment tendons as well as ECU tendinopathy. Her symptoms are under better control today. I explained the possible cause of her symptoms could be wrist joint synovitis versus tendinitis. She has had evaluation by rheumatology. They suspect her ANA is just a normal variation and that she does not have any specific underlying rheumatoid condition, however, she still does have some polyarthralgia and may want to consider second opinion in the future.  - She does not have significant symptoms today. She has some tenderness palpation and decreased range of motion of the wrist but this is not as severe as it normally is to her. I discussed the uncertainty of the exact cause of her symptoms but basic conservative treatments moving forward with activity modification, bracing, home exercise, physical therapy, medication, ice/compression, heat/massage, topical pain cream, steroid injection. Because her symptoms are not too bad at this point, I recommended occupational therapy to see if this can provide her with more symptom relief exercises to better support the wrist.  - Activity as tolerated. Modify as needed according to symptoms. No limitations to pain-free weight bearing but I did recommend using a neutral wrist position. She can use her short arm wrist brace as needed.  - Refer to PT for pain relieving modalities, manual techniques as indicated including dry needling and/for instrument assisted soft tissue techniques, home exercise  planning, iontophoresis. Continue home exercise program to maintain strength, flexibility, and endurance.  - Use Tylenol, anti-inflammatories, topical diclofenac/pain cream, relative rest, elevation, compression, massage, and ice/heat as needed for pain.   - Follow up as needed depending on flare of symptoms. We can expedite reevaluation if she has a significant flare and consider diagnostic/therapeutic injection at that time.    Patient Stated Goals Want my pain better so I can do my  work and do my workouts and play with my daughter    Currently in Pain? Yes    Pain Score 3    AROM in session -and towards end and tender 7/10   Pain Location Wrist    Pain Orientation Right    Pain Descriptors / Indicators Tender;Throbbing    Pain Type Acute pain    Pain Onset More than a month ago    Pain Frequency Intermittent                [facility] OT Assessment - 03/29/22 0001       AROM   Right Wrist Extension 55 Degrees   pain or strain   Right Wrist Flexion 95 Degrees    Right Wrist Radial Deviation 30 Degrees    Right Wrist Ulnar Deviation 25 Degrees      Strength   Right Hand Grip (lbs) 50    Right Hand Lateral Pinch 15 lbs    Right Hand 3 Point Pinch 18 lbs   ulnar side pain   Left Hand Grip (lbs) 65    Left Hand Lateral Pinch 15 lbs    Left Hand 3 Point Pinch 17 lbs                      OT Treatments/Exercises (OP) - 03/29/22 0001       RUE Contrast Bath   Time 8 minutes    Comments decrease pain after ROM increased her pain             TODAY'S TREATMENT:  Assess active range of motion for wrist in all planes this date.  Patient coming in with reports of no pain.  Flexion, radial and ulnar deviation and supination pronation within normal limits and good strength with no pain.   Wrist extension still at 55 with pain over TFCC area.   Assess grip and prehension strength patient on upper level for her age but pain increased after assessment of wrist extension and then grip as well as three-point pinch.   Pain with active range of motion is 3/10 but increased to a 7/10 at the most and with tenderness over the TFCC area.  Done contrast to decrease pain and tenderness patient to wear wrist brace most of the time and has appointment with Dr. Landry Mellow tomorrow. If patient does get a shot recommend for her to keep her wrist brace on for about 3 days not to do contrast as well as not push any active range of motion passive range of  motion.       PATIENT EDUCATION: Patient was educated in home program as well as modifications, and splint fitting.  Handout was provided and patient educated and reviewed on home program able to verbalize and demo understanding HOME EXERCISE PROGRAM: See treatment note GOALS: Goals reviewed with patient? Yes   SHORT TERM GOALS: Target date: 05/11/22/2023   Patient to be independent in home program of wearing a splint to show increased active range of motion with pain less than 2/10  Baseline: Pain improved for patient only to have pain with wrist extension at 55 degrees 3/10.  Other motions pain-free and within normal limits.  Goal status: progressing LONG TERM GOALS: Target date: 03/28/2022   Patient's right wrist active range of motion increase to within normal limits with out increased symptoms in all planes Baseline: Wrist pain mostly on dorsal wrist 4-6/10 with use this past week and in session with decreased wrist extension and flexion NOW active range of motion within normal limits except wrist extension and 55 and pain at TFCC Goal status: Progressing   2.  Right wrist pain and active range of motion improve to initiate strengthening and patient able to wean out of wrist splint Baseline: Patient wearing a wrist splint starting this date 24/7 off with ADLs and home program.  Patient has 4-6/10 dorsal wrist pain with active range of motion and use.  NOW  Cannot tolerate any strengthening because of wrist extension still limited in pain 7/10 at the most - over TFCC with wrist extension Goal status: Progressing   3.  Right wrist active range of motion and strength in proved with no increase symptoms for patient to push and pull heavy door use and ADLs with no increase symptoms. Baseline: Not able to do any resistance or strengthening without pain, showed decreased active range of motion, 4-6/10 pain on dorsal wrist with use NOW wrist extension still limited in pain with wrist extension  at 60 to 65 degrees TFCC Goal status: Progressing     ASSESSMENT:   CLINICAL IMPRESSION: Patient made great progress from start of care and increasing active range of motion pain-free at wrist as well as digit extension.  She modified her work tasks at home that also contributed to a lot of her relief in pain over extensors of second and third digits.  She has been limited with hard end for wrist extension for a few visits now.  Did initiated ionto few weeks ago that allowed to be able to to do a little bit more passive range of motion and joint mobs -to allow increased wrist extension.  The patient the last 3-4 visits limited again with wrist extension progress with pain on the ulnar wrist over the TFCC area. Do not progress more than 55 degrees of AROM extention-  If patient in wrist brace and not doing wrist extension she is pain-free but as soon as she does wrist extension she has increased pain up to 7/10 with increased tenderness as well as throbbing with three-point pinch, gripping and even at rest.  Referred to Dr. Patriciaann ClanKubinski-she has appointment tomorrow 7 June with him.  Patient limited in right dominant hand functional use by pain decreased range of motion decreased strength in ADLs and IADLs.  Patient can benefit from OT services to decrease pain with increased active range of motion and strength PERFORMANCE DEFICITS in functional skills including ADLs, IADLs, ROM, strength, pain, flexibility, and UE functional use, uding    IMPAIRMENTS are limiting patient from ADLs, IADLs, work, play, leisure, and social participation.    COMORBIDITIES has no other co-morbidities that affects occupational performance. Patient will benefit from skilled OT to address above impairments and improve overall function.   MODIFICATION OR ASSISTANCE TO COMPLETE EVALUATION: No modification of tasks or assist necessary to complete an evaluation.   OT OCCUPATIONAL PROFILE AND HISTORY: Problem focused assessment:  Including review of records relating to presenting problem.   CLINICAL DECISION MAKING: LOW - limited treatment options, no task modification necessary  REHAB POTENTIAL: Good   EVALUATION COMPLEXITY: Low         PLAN: OT FREQUENCY: 1 x/week   OT DURATION: 6 weeks   PLANNED INTERVENTIONS: self care/ADL training, therapeutic exercise, manual therapy, passive range of motion, splinting, ultrasound, iontophoresis, fluidotherapy, contrast bath, patient/family education, and DME and/or AE instructions       CONSULTED AND AGREED WITH PLAN OF CARE: Patient   PLAN FOR NEXT SESSION: Patient to see Dr. Landry Mellow tomorrow for continues ulnar wrist pain limiting wrist extension as well as weaning out of brace.                  OT Education - 03/29/22 0859     Education Details Progress and changes to home program    Person(s) Educated Patient    Methods Explanation;Demonstration;Verbal cues;Handout;Tactile cues    Comprehension Verbalized understanding;Returned demonstration;Tactile cues required;Verbal cues required                         Patient will benefit from skilled therapeutic intervention in order to improve the following deficits and impairments:           Visit Diagnosis: Pain in right wrist  Stiffness of right wrist, not elsewhere classified    Problem List Patient Active Problem List   Diagnosis Date Noted   Post-operative state 06/07/2016    Oletta Cohn, OTR/L,CLT 03/29/2022, 9:44 AM  [place] [facility] [address] [place], [address] Phone: [phone]   Fax:  [phone]  Name: Tonya Woodward MRN: [id number] Date of Birth: May 06, 1976

## 2022-04-05 ENCOUNTER — Ambulatory Visit: Payer: 59 | Admitting: Occupational Therapy

## 2022-04-05 DIAGNOSIS — M25531 Pain in right wrist: Secondary | ICD-10-CM | POA: Diagnosis not present

## 2022-04-05 DIAGNOSIS — M25631 Stiffness of right wrist, not elsewhere classified: Secondary | ICD-10-CM

## 2022-04-05 NOTE — Therapy (Signed)
[place] [facility] [address] [place], [place], [address] Phone: [phone]   Fax:  [phone]  Occupational Therapy Treatment  Patient Details  Name: Tonya BogaMartha R Woodward MRN: [id number] Date of Birth: 02/19/1976 Referring Provider (OT): DR Landry MellowKubinski   Encounter Date: 04/05/2022   OT End of Session - 04/05/22 1135     Visit Number 14    Number of Visits 19    Date for OT Re-Evaluation 05/10/22    OT Start Time 0948    OT Stop Time 1017    OT Time Calculation (min) 29 min    Activity Tolerance Patient tolerated treatment well    Behavior During Therapy [facility] for tasks assessed/performed             Past Medical History:  Diagnosis Date   Diabetes mellitus without complication (HCC)    Gestational   GERD (gastroesophageal reflux disease)    Heart murmur    Hypothyroidism     Past Surgical History:  Procedure Laterality Date   CESAREAN SECTION N/A 06/07/2016   Procedure: CESAREAN SECTION;  Surgeon: Suzy Bouchardhomas J Schermerhorn, MD;  Location: [facility] ORS;  Service: Obstetrics;  Laterality: N/A;   DIAGNOSTIC LAPAROSCOPY      There were no vitals filed for this visit.   Subjective Assessment - 04/05/22 1134     Subjective  The shot was not bad but the next day or 2 after that it was really achy and sore and I had to take a Tylenol.  And keeping in the brace Dr. Landry MellowKubinski said maybe down the road for me to see a hand surgeon    Pertinent History ICD-10-CM   1. 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 The plan is to do the following;    - The patient has chronic right wrist pain of unknown etiology without known injury. She does have recent MRI showing some edema around the  dorsal wrist and second/third dorsal compartment tendons as well as ECU tendinopathy. Her symptoms are under better control today. I explained the possible cause of her symptoms could be wrist joint synovitis versus tendinitis. She has had evaluation by rheumatology. They suspect her ANA is just a normal variation and that she does not have any specific underlying rheumatoid condition, however, she still does have some polyarthralgia and may want to consider second opinion in the future.  - She does not have significant symptoms today. She has some tenderness palpation and decreased range of motion of the wrist but this is not as severe as it normally is to her. I discussed the uncertainty of the exact cause of her symptoms but basic conservative treatments moving forward with activity modification, bracing, home exercise, physical therapy, medication, ice/compression, heat/massage, topical pain cream, steroid injection. Because her symptoms are not too bad at this point, I recommended occupational therapy to see if this can provide her with more symptom relief exercises to better support the wrist.  - Activity as tolerated. Modify as needed according to symptoms. No limitations to pain-free weight bearing but I did recommend using a neutral wrist position. She can use her short arm wrist brace as needed.  - Refer to PT for pain relieving  modalities, manual techniques as indicated including dry needling and/for instrument assisted soft tissue techniques, home exercise planning, iontophoresis. Continue home exercise program to maintain strength, flexibility, and endurance.  - Use Tylenol, anti-inflammatories, topical diclofenac/pain cream, relative rest, elevation, compression, massage, and ice/heat as needed for pain.   - Follow up as needed depending on flare of symptoms. We can expedite reevaluation if she has a significant flare and consider diagnostic/therapeutic injection at that time.    Patient Stated Goals  Want my pain better so I can do my work and do my workouts and play with my daughter    Currently in Pain? No/denies                [facility] OT Assessment - 04/05/22 0001       AROM   Right Wrist Extension 58 Degrees    Right Wrist Flexion 95 Degrees    Right Wrist Radial Deviation 30 Degrees    Right Wrist Ulnar Deviation 25 Degrees                      OT Treatments/Exercises (OP) - 04/05/22 0001       RUE Fluidotherapy   Number Minutes Fluidotherapy 8 Minutes    RUE Fluidotherapy Location Hand;Wrist    Comments Wrist active range of motion all planes pain-free             TODAY'S TREATMENT:  Patient arrives after having a shot last week at ulnar wrist around TFCC area.  Assess active range of motion patient able to get wrist extension 58 degrees at the right wrist with a slight pull over ECU.   Patient is active range of motion for supination pronation as well as radial ulnar deviation and wrist flexion within normal limits pain-free. Patient pain was not over the TFCC area this date after fluidotherapy patient had about the same active range of motion as well as slight pull over ECU.  Up into lateral epicondyle patient reports some strain. Patient continued to do moist heat and add light passive range of motion for wrist extension pain-free prior to active range of motion patient to do that the rest of the week follow-up with me early next week to assess if still continues to be pain-free over TFCC area and able to increase wrist extension without irritating ECU or lateral epicondyle.  Did had less stiffness after the fluidotherapy and did some soft tissue massage to webspace metacarpal spreads as well as Retail [employer] #[employer] for volar and dorsal forearm and wrist sweeping    PATIENT EDUCATION: Patient was educated in home program as well as modifications, and splint fitting.  Handout was provided and patient educated and reviewed on home program able to  verbalize and demo understanding HOME EXERCISE PROGRAM: See treatment note GOALS: Goals reviewed with patient? Yes   SHORT TERM GOALS: Target date: 05/11/22/2023   Patient to be independent in home program of wearing a splint to show increased active range of motion with pain less than 2/10 Baseline: Pain improved for patient only to have pain with wrist extension at 55 degrees 3/10.  Other motions pain-free and within normal limits.  Goal status: progressing LONG TERM GOALS: Target date: 03/28/2022   Patient's right wrist active range of motion increase to within normal limits with out increased symptoms in all planes Baseline: Wrist pain mostly on dorsal wrist 4-6/10 with use this past week and in session with decreased wrist extension and flexion NOW active range  of motion within normal limits except wrist extension and 55 and pain at TFCC Goal status: Progressing   2.  Right wrist pain and active range of motion improve to initiate strengthening and patient able to wean out of wrist splint Baseline: Patient wearing a wrist splint starting this date 24/7 off with ADLs and home program.  Patient has 4-6/10 dorsal wrist pain with active range of motion and use.  NOW  Cannot tolerate any strengthening because of wrist extension still limited in pain 7/10 at the most - over TFCC with wrist extension Goal status: Progressing   3.  Right wrist active range of motion and strength in proved with no increase symptoms for patient to push and pull heavy door use and ADLs with no increase symptoms. Baseline: Not able to do any resistance or strengthening without pain, showed decreased active range of motion, 4-6/10 pain on dorsal wrist with use NOW wrist extension still limited in pain with wrist extension at 60 to 65 degrees TFCC Goal status: Progressing     ASSESSMENT:   CLINICAL IMPRESSION: Patient made great progress in active range of motion with less pain at ECU as well as second and third  digit extensors.  Patient was able to do modifications at home and at her workstation.  Active range of motion and strength improved greatly except wrist extension.  Patient had last few weeks increased pain and discomfort over TFCC area patient had a shot by Dr. Landry Mellow last week.  Patient to contact him in 2 weeks to let him know if continue to improve.  Patient this date with wrist extension 58 but strain over ECU into lateral epicondyle.  At this stage passive range of motion likely to wrist extension prior to AROM.  We will check on patient in a week to assess if able to increase wrist extension pain-free..  Patient limited in right dominant hand functional use by pain decreased range of motion decreased strength in ADLs and IADLs.  Patient can benefit from OT services to decrease pain with increased active range of motion and strength PERFORMANCE DEFICITS in functional skills including ADLs, IADLs, ROM, strength, pain, flexibility, and UE functional use, uding    IMPAIRMENTS are limiting patient from ADLs, IADLs, work, play, leisure, and social participation.    COMORBIDITIES has no other co-morbidities that affects occupational performance. Patient will benefit from skilled OT to address above impairments and improve overall function.   MODIFICATION OR ASSISTANCE TO COMPLETE EVALUATION: No modification of tasks or assist necessary to complete an evaluation.   OT OCCUPATIONAL PROFILE AND HISTORY: Problem focused assessment: Including review of records relating to presenting problem.   CLINICAL DECISION MAKING: LOW - limited treatment options, no task modification necessary   REHAB POTENTIAL: Good   EVALUATION COMPLEXITY: Low         PLAN: OT FREQUENCY: 1 x/week   OT DURATION: 6 weeks   PLANNED INTERVENTIONS: self care/ADL training, therapeutic exercise, manual therapy, passive range of motion, splinting, ultrasound, iontophoresis, fluidotherapy, contrast bath, patient/family  education, and DME and/or AE instructions       CONSULTED AND AGREED WITH PLAN OF CARE: Patient   PLAN FOR NEXT SESSION: Assess if increase extention of wrist with less pain                        OT Education - 03/29/22 0859       Education Details Progress and changes to home program  Person(s) Educated Patient     Methods Explanation;Demonstration;Verbal cues;Handout;Tactile cues     Comprehension Verbalized understanding;Returned demonstration;Tactile cues required;Verbal cues required                  OT Education - 04/05/22 1135     Education Details Progress and changes to home program    Person(s) Educated Patient    Methods Explanation;Demonstration;Verbal cues;Handout;Tactile cues    Comprehension Verbalized understanding;Returned demonstration;Tactile cues required;Verbal cues required                         Patient will benefit from skilled therapeutic intervention in order to improve the following deficits and impairments:           Visit Diagnosis: Pain in right wrist  Stiffness of right wrist, not elsewhere classified    Problem List Patient Active Problem List   Diagnosis Date Noted   Post-operative state 06/07/2016    Oletta Cohn, OTR/L,CLT 04/05/2022, 1:36 PM  [place] [facility] [address], [place], [address] Phone: [phone]   Fax:  [phone]  Name: SARYA LINENBERGER MRN: [id number] Date of Birth: [birth year]

## 2022-04-12 ENCOUNTER — Encounter: Payer: Self-pay | Admitting: Occupational Therapy

## 2022-04-12 ENCOUNTER — Ambulatory Visit: Payer: 59 | Admitting: Occupational Therapy

## 2022-04-12 DIAGNOSIS — M25631 Stiffness of right wrist, not elsewhere classified: Secondary | ICD-10-CM

## 2022-04-12 DIAGNOSIS — M25531 Pain in right wrist: Secondary | ICD-10-CM | POA: Diagnosis not present

## 2022-04-12 NOTE — Therapy (Signed)
[place] [facility] [address], [place], [address] Phone: [phone]   Fax:  [phone]  Occupational Therapy Treatment  Patient Details  Name: GRACEY TOLLE MRN: [id number] Date of Birth: 11-23-1975 Referring Provider (OT): DR Landry Mellow   Encounter Date: 04/12/2022   OT End of Session - 04/12/22 1024     Visit Number 15    Number of Visits 19    Date for OT Re-Evaluation 05/10/22    OT Start Time 0955    OT Stop Time 1019    OT Time Calculation (min) 24 min    Activity Tolerance Patient tolerated treatment well    Behavior During Therapy [facility] for tasks assessed/performed             Past Medical History:  Diagnosis Date   Diabetes mellitus without complication (HCC)    Gestational   GERD (gastroesophageal reflux disease)    Heart murmur    Hypothyroidism     Past Surgical History:  Procedure Laterality Date   CESAREAN SECTION N/A 06/07/2016   Procedure: CESAREAN SECTION;  Surgeon: Suzy Bouchard, MD;  Location: [facility] ORS;  Service: Obstetrics;  Laterality: N/A;   DIAGNOSTIC LAPAROSCOPY      There were no vitals filed for this visit.   Subjective Assessment - 04/12/22 1022     Subjective  Saturday  and Sunday my wrist was bothering me on that side.  But did not do anything differently day before.  I do avoid twisting like a doorknob and then gripping or squeezing tight.  Wearing the brace most the time    Pertinent History ICD-10-CM   1. 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 The plan is to do the following;    - The patient has chronic right wrist pain of unknown etiology without known injury. She does have recent MRI showing some edema  around the dorsal wrist and second/third dorsal compartment tendons as well as ECU tendinopathy. Her symptoms are under better control today. I explained the possible cause of her symptoms could be wrist joint synovitis versus tendinitis. She has had evaluation by rheumatology. They suspect her ANA is just a normal variation and that she does not have any specific underlying rheumatoid condition, however, she still does have some polyarthralgia and may want to consider second opinion in the future.  - She does not have significant symptoms today. She has some tenderness palpation and decreased range of motion of the wrist but this is not as severe as it normally is to her. I discussed the uncertainty of the exact cause of her symptoms but basic conservative treatments moving forward with activity modification, bracing, home exercise, physical therapy, medication, ice/compression, heat/massage, topical pain cream, steroid injection. Because her symptoms are not too bad at this point, I recommended occupational therapy to see if this can provide her with more symptom relief exercises to better support the wrist.  - Activity as tolerated. Modify as needed according to symptoms. No limitations to pain-free weight bearing but I did recommend using a neutral wrist position. She can use her short arm wrist brace as needed.  - Refer to PT for pain relieving modalities, manual techniques as  indicated including dry needling and/for instrument assisted soft tissue techniques, home exercise planning, iontophoresis. Continue home exercise program to maintain strength, flexibility, and endurance.  - Use Tylenol, anti-inflammatories, topical diclofenac/pain cream, relative rest, elevation, compression, massage, and ice/heat as needed for pain.   - Follow up as needed depending on flare of symptoms. We can expedite reevaluation if she has a significant flare and consider diagnostic/therapeutic injection at that time.    Patient  Stated Goals Want my pain better so I can do my work and do my workouts and play with my daughter    Currently in Pain? Yes    Pain Score 3     Pain Location Wrist    Pain Orientation Right    Pain Descriptors / Indicators Tender;Tightness    Pain Type Chronic pain;Acute pain    Pain Onset More than a month ago    Pain Frequency Intermittent                [facility] OT Assessment - 04/12/22 0001       AROM   Right Wrist Extension 50 Degrees    Right Wrist Flexion 95 Degrees    Right Wrist Radial Deviation 25 Degrees    Right Wrist Ulnar Deviation 25 Degrees      Strength   Right Hand Grip (lbs) 45   pain   Right Hand Lateral Pinch 14 lbs    Right Hand 3 Point Pinch 18 lbs    Left Hand Grip (lbs) 65    Left Hand Lateral Pinch 15 lbs    Left Hand 3 Point Pinch 17 lbs               TODAY'S TREATMENT:  Assess right active range of motion and strength in all planes.  As well as grip and prehension.   See flowsheet.   Patient arrives after having a shot about 2 wks ago at ulnar wrist around TFCC area.  Assess active range of motion patient able to get wrist extension 50 degrees at the right wrist with a slight pull over ECU.   Patient is active range of motion for supination pronation as well as radial ulnar deviation and wrist flexion within normal limits pain-free. Patient cont to be tender over TFCC area  - still limited in wrist extention - tight and strain then ECU.   Up into lateral epicondyle patient reports some strain. Strength in wrist in all planes 5/5 - pain free  Grip decrease with some pain over ulnar wrist this date  Prehension WNL and pain free       PATIENT EDUCATION: HEP to cont with  HOME EXERCISE PROGRAM: See treatment note GOALS: Goals reviewed with patient? Yes   SHORT TERM GOALS: Target date: 05/11/22/2023   Patient to be independent in home program of wearing a splint to show increased active range of motion with pain less than  2/10 Baseline: Pain improved for patient only to have pain with wrist extension at 55 degrees 3/10.  Other motions pain-free and within normal limits.  Goal status: progressing LONG TERM GOALS: Target date: 03/28/2022   Patient's right wrist active range of motion increase to within normal limits with out increased symptoms in all planes Baseline: Wrist pain mostly on dorsal wrist 4-6/10 with use this past week and in session with decreased wrist extension and flexion NOW active range of motion within normal limits except wrist extension and 50-55 and pain at TFCC Goal status: Progressing   2.  Right wrist pain and active range of motion improve to initiate strengthening and patient able to wean out of wrist splint Baseline: Patient wearing a wrist splint starting this date 24/7 off with ADLs and home program.  Patient has 4-6/10 dorsal wrist pain with active range of motion and use.  NOW  Cannot tolerate any strengthening because of wrist extension still limited over TFCC with wrist extension - strength 5/5  Goal status: Progressing   3.  Right wrist active range of motion and strength in proved with no increase symptoms for patient to push and pull heavy door use and ADLs with no increase symptoms. Baseline: Not able to do any resistance or strengthening without pain, showed decreased active range of motion, 4-6/10 pain on dorsal wrist with use NOW wrist extension still limited in pain with wrist extension at 50-55 degrees- pain over  TFCC Goal status: Progressing     ASSESSMENT:   CLINICAL IMPRESSION: Patient made great progress in active range of motion with less pain at ECU as well as second and third digit extensors.  Patient was able to do modifications at home and at her workstation.  Active range of motion and strength improved greatly except wrist extension.  Patient cont to be limited in pain now mostly over  TFCC area  limiting progress- patient had a shot by Dr. Landry Mellow 2 weeks ago.   Patient to contact Dr. Landry Mellow about a possible referral to a hand surgeon and as well as maybe a MRI with contrast prior to that.  Patient this date with wrist extension 50 but strain over ECU into lateral epicondyle.  Patient this date also with decreased grip strength and pain on ulnar wrist.  Patient limited in right dominant hand functional use by pain decreased range of motion in ADLs and IADLs.  Patient can benefit from OT services to decrease pain with increased active range of motion and strength but need further assessment and intervention from hand orthopedic PERFORMANCE DEFICITS in functional skills including ADLs, IADLs, ROM, strength, pain, flexibility, and UE functional use, uding    IMPAIRMENTS are limiting patient from ADLs, IADLs, work, play, leisure, and social participation.    COMORBIDITIES has no other co-morbidities that affects occupational performance. Patient will benefit from skilled OT to address above impairments and improve overall function.   MODIFICATION OR ASSISTANCE TO COMPLETE EVALUATION: No modification of tasks or assist necessary to complete an evaluation.   OT OCCUPATIONAL PROFILE AND HISTORY: Problem focused assessment: Including review of records relating to presenting problem.   CLINICAL DECISION MAKING: LOW - limited treatment options, no task modification necessary   REHAB POTENTIAL: Good   EVALUATION COMPLEXITY: Low         PLAN: OT FREQUENCY: 1 x/week   OT DURATION: 6 weeks   PLANNED INTERVENTIONS: self care/ADL training, therapeutic exercise, manual therapy, passive range of motion, splinting, ultrasound, iontophoresis, fluidotherapy, contrast bath, patient/family education, and DME and/or AE instructions       CONSULTED AND AGREED WITH PLAN OF CARE: Patient   PLAN FOR NEXT SESSION: Patient to contact Dr. Landry Mellow about a possible referral to hand orthopedics                                                   OT Education - 04/12/22 1024     Education Details Progress and  changes to home program, recommendations    Person(s) Educated Patient    Methods Explanation;Demonstration;Verbal cues;Handout;Tactile cues    Comprehension Verbalized understanding;Returned demonstration;Tactile cues required;Verbal cues required                         Patient will benefit from skilled therapeutic intervention in order to improve the following deficits and impairments:           Visit Diagnosis: Pain in right wrist  Stiffness of right wrist, not elsewhere classified    Problem List Patient Active Problem List   Diagnosis Date Noted   Post-operative state 06/07/2016    Oletta Cohn, OTR/L,CLT 04/12/2022, 10:26 AM  [place] [facility] [address] [place], [address] Phone: [phone]   Fax:  [phone]  Name: DONALD JACQUE MRN: [id number] Date of Birth: 23-Nov-1975

## 2022-04-18 ENCOUNTER — Other Ambulatory Visit: Payer: Self-pay | Admitting: Sports Medicine

## 2022-04-18 DIAGNOSIS — M6798 Unspecified disorder of synovium and tendon, other site: Secondary | ICD-10-CM

## 2022-04-18 DIAGNOSIS — G8929 Other chronic pain: Secondary | ICD-10-CM

## 2022-04-18 DIAGNOSIS — M65831 Other synovitis and tenosynovitis, right forearm: Secondary | ICD-10-CM

## 2022-04-18 DIAGNOSIS — M6790 Unspecified disorder of synovium and tendon, unspecified site: Secondary | ICD-10-CM

## 2022-05-06 ENCOUNTER — Ambulatory Visit
Admission: RE | Admit: 2022-05-06 | Discharge: 2022-05-06 | Disposition: A | Discharge status: Home / Self Care | Payer: 59 | Source: Ambulatory Visit | Attending: Sports Medicine | Admitting: Sports Medicine

## 2022-05-06 DIAGNOSIS — M65831 Other synovitis and tenosynovitis, right forearm: Secondary | ICD-10-CM

## 2022-05-06 DIAGNOSIS — M6798 Unspecified disorder of synovium and tendon, other site: Secondary | ICD-10-CM

## 2022-05-06 DIAGNOSIS — M6790 Unspecified disorder of synovium and tendon, unspecified site: Secondary | ICD-10-CM | POA: Insufficient documentation

## 2022-05-06 DIAGNOSIS — G8929 Other chronic pain: Secondary | ICD-10-CM

## 2022-05-06 DIAGNOSIS — M25531 Pain in right wrist: Secondary | ICD-10-CM | POA: Diagnosis not present

## 2022-05-06 MED ORDER — LIDOCAINE HCL (PF) 1 % IJ SOLN
10.0000 mL | Freq: Once | INTRAMUSCULAR | Status: AC
  Administered 2022-05-06: 2 mL via INTRADERMAL

## 2022-05-06 MED ORDER — SODIUM CHLORIDE (PF) 0.9 % IJ SOLN
10.0000 mL | INTRAMUSCULAR | Status: DC | PRN
  Administered 2022-05-06: 1 mL via INTRAVENOUS

## 2022-05-06 MED ORDER — IOHEXOL 180 MG/ML  SOLN
20.0000 mL | Freq: Once | INTRAMUSCULAR | Status: AC | PRN
  Administered 2022-05-06: 2 mL

## 2022-05-06 MED ORDER — GADOBUTROL 1 MMOL/ML IV SOLN
2.0000 mL | Freq: Once | INTRAVENOUS | Status: AC | PRN
  Administered 2022-05-06: 2 mL

## 2023-04-10 ENCOUNTER — Ambulatory Visit
Admission: EM | Admit: 2023-04-10 | Discharge: 2023-04-10 | Disposition: A | Discharge status: Home / Self Care | Payer: 59 | Attending: Physician Assistant | Admitting: Physician Assistant

## 2023-04-10 DIAGNOSIS — H9201 Otalgia, right ear: Secondary | ICD-10-CM | POA: Diagnosis present

## 2023-04-10 DIAGNOSIS — J029 Acute pharyngitis, unspecified: Secondary | ICD-10-CM

## 2023-04-10 LAB — GROUP A STREP BY PCR: Group A Strep by PCR: NOT DETECTED

## 2023-04-10 MED ORDER — LIDOCAINE VISCOUS HCL 2 % MT SOLN: 15.0000 mL | OROMUCOSAL | 0 refills | Status: AC | PRN

## 2023-04-10 NOTE — Discharge Instructions (Addendum)
-  Strep was negative.  You do not have an ear infection. - Your symptoms are consistent with viral pharyngitis.  Since symptoms just started he may develop other symptoms such as congestion and cough.  Increase rest and fluids and consider use of ibuprofen, Tylenol, Chloraseptic spray for pain relief.  I sent viscous lidocaine to the pharmacy.  Most viral infections get better within about a week.

## 2023-04-10 NOTE — ED Provider Notes (Signed)
[facility]    CSN: [id number] Arrival date & time: 04/10/23  0801      History   Chief Complaint Chief Complaint  Patient presents with   Otalgia    HPI Tonya Woodward is a 47 y.o. female presenting for right-sided ear pain and sore throat as well as swollen lymph node over right side of neck x 2 days.  Reports a lot of pain with swallowing.  Denies fever, cough, congestion.  Denies any sick contacts.  Has not been using any OTC meds.  No other concerns.  HPI  Past Medical History:  Diagnosis Date   Diabetes mellitus without complication (HCC)    Gestational   GERD (gastroesophageal reflux disease)    Heart murmur    Hypothyroidism     Patient Active Problem List   Diagnosis Date Noted   Post-operative state 06/07/2016    Past Surgical History:  Procedure Laterality Date   CESAREAN SECTION N/A 06/07/2016   Procedure: CESAREAN SECTION;  Surgeon: Suzy Bouchard, MD;  Location: [facility] ORS;  Service: Obstetrics;  Laterality: N/A;   DIAGNOSTIC LAPAROSCOPY      OB History     Gravida  2   Para  1   Term  1   Preterm  0   AB  1   Living  1      SAB  1   IAB  0   Ectopic  0   Multiple  0   Live Births  1            Home Medications    Prior to Admission medications   Medication Sig Start Date End Date Taking? Authorizing Provider  lidocaine (XYLOCAINE) 2 % solution Use as directed 15 mLs in the mouth or throat every 3 (three) hours as needed for mouth pain (swish and spit). 04/10/23  Yes Eusebio Friendly B, PA-C  albuterol (VENTOLIN HFA) 108 (90 Base) MCG/ACT inhaler Inhale 2 puffs into the lungs every 4 (four) hours as needed. 03/03/21   Becky Augusta, NP  Multiple Vitamins-Minerals (MULTIVITAMIN WITH MINERALS) tablet Take 1 tablet by mouth daily.    [provider]  Spacer/Aero-Holding Chambers (AEROCHAMBER MV) inhaler Use as instructed 03/03/21   Becky Augusta, NP  SYNTHROID 100 MCG tablet Take 100 mcg by mouth daily.  11/16/20   [provider]  Vitamin D, Ergocalciferol, (DRISDOL) 1.25 MG (50000 UNIT) CAPS capsule Take 1 capsule by mouth once a week. 02/05/21   [provider]  ferrous sulfate 325 (65 FE) MG tablet Take 1 tablet (325 mg total) by mouth 2 (two) times daily with a meal. For anemia, take with Vitamin C 06/09/16 03/03/21  Christeen Douglas, MD  omeprazole (PRILOSEC) 40 MG capsule Take 40 mg by mouth as needed.  03/03/21  [provider]    Family History History reviewed. No pertinent family history.  Social History Social History   Tobacco Use   Smoking status: Former    Packs/day: .25    Types: Cigarettes   Smokeless tobacco: Never  Vaping Use   Vaping Use: Never used  Substance Use Topics   Alcohol use: No   Drug use: No     Allergies   Tape   Review of Systems Review of Systems  Constitutional:  Negative for chills, diaphoresis, fatigue and fever.  HENT:  Positive for ear pain and sore throat. Negative for congestion, ear discharge, facial swelling, hearing loss, rhinorrhea and sinus pain.   Respiratory:  Negative for cough.   Neurological:  Negative for dizziness, weakness and headaches.  Hematological:  Positive for adenopathy.     Physical Exam Triage Vital Signs ED Triage Vitals [04/10/23 0817]  Enc Vitals Group     BP 116/74     Pulse Rate 63     Resp 16     Temp 98 F (36.7 C)     Temp Source Oral     SpO2 96 %     Weight      Height      Head Circumference      Peak Flow      Pain Score 5     Pain Loc      Pain Edu?      Excl. in GC?    No data found.  Updated Vital Signs BP 116/74 (BP Location: Left Arm)   Pulse 63   Temp 98 F (36.7 C) (Oral)   Resp 16   LMP 04/05/2023 (Approximate)   SpO2 96%       Physical Exam Vitals and nursing note reviewed.  Constitutional:      General: She is not in acute distress.    Appearance: Normal appearance. She is not ill-appearing or toxic-appearing.  HENT:     Head:  Normocephalic and atraumatic.     Right Ear: Tympanic membrane, ear canal and external ear normal.     Left Ear: Tympanic membrane, ear canal and external ear normal.     Nose: Nose normal.     Mouth/Throat:     Mouth: Mucous membranes are moist.     Pharynx: Oropharynx is clear. Posterior oropharyngeal erythema present.  Eyes:     General: No scleral icterus.       Right eye: No discharge.        Left eye: No discharge.     Conjunctiva/sclera: Conjunctivae normal.  Cardiovascular:     Rate and Rhythm: Normal rate and regular rhythm.     Heart sounds: Normal heart sounds.  Pulmonary:     Effort: Pulmonary effort is normal. No respiratory distress.  Musculoskeletal:     Cervical back: Neck supple.  Lymphadenopathy:     Cervical: Cervical adenopathy (right anterior neck. TTP) present.  Skin:    General: Skin is dry.  Neurological:     General: No focal deficit present.     Mental Status: She is alert. Mental status is at baseline.     Motor: No weakness.     Gait: Gait normal.  Psychiatric:        Mood and Affect: Mood normal.        Behavior: Behavior normal.        Thought Content: Thought content normal.      UC Treatments / Results  Labs (all labs ordered are listed, but only abnormal results are displayed) Labs Reviewed  GROUP A STREP BY PCR    EKG   Radiology No results found.  Procedures Procedures (including critical care time)  Medications Ordered in UC Medications - No data to display  Initial Impression / Assessment and Plan / UC Course  I have reviewed the triage vital signs and the nursing notes.  Pertinent labs & imaging results that were available during my care of the patient were reviewed by me and considered in my medical decision making (see chart for details).   47 year old female presents for sore throat, right-sided ear pain x 2 days.  No fever, cough or ingestion.  On  exam there is no evidence of an ear infection but she does have  erythema of the posterior pharynx, especially on the right side and a tender enlarged right anterior cervical lymph node.  PCR strep test obtained.  Negative.  Discussed results with patient.  Viral pharyngitis.  Supportive care encouraged.  Discussed OTC meds.  Sent viscous lidocaine to pharmacy.  Follow-up as needed.   Final Clinical Impressions(s) / UC Diagnoses   Final diagnoses:  Acute pharyngitis, unspecified etiology  Otalgia of right ear     Discharge Instructions      -Strep was negative.  You do not have an ear infection. - Your symptoms are consistent with viral pharyngitis.  Since symptoms just started he may develop other symptoms such as congestion and cough.  Increase rest and fluids and consider use of ibuprofen, Tylenol, Chloraseptic spray for pain relief.  I sent viscous lidocaine to the pharmacy.  Most viral infections get better within about a week.     ED Prescriptions     Medication Sig Dispense Auth. Provider   lidocaine (XYLOCAINE) 2 % solution Use as directed 15 mLs in the mouth or throat every 3 (three) hours as needed for mouth pain (swish and spit). 100 mL Shirlee Latch, PA-C      PDMP not reviewed this encounter.   Shirlee Latch, PA-C 04/10/23 [phone]

## 2023-04-10 NOTE — ED Triage Notes (Signed)
Patient presents to [facility] for right ear pain since 2 days. Has not used any OTC meds.

## 2023-06-09 IMAGING — MR MR WRIST*R* W/O CM
5 of 7 series · 24 of 40 positions shown · non-contrast
Comparison: None.

CLINICAL DATA: Top of right wrist pain radiating to forearm for 1
year, and notes possible injury when playing with daughter.

EXAM:
MR OF THE RIGHT WRIST WITHOUT CONTRAST
TECHNIQUE: Multiplanar, multisequence MR imaging of the right wrist was
performed. No intravenous contrast was administered.

[Series 3: T2 fat-sat · axial · 3.0mm · 0.39mm/px · z∈[-62,+6]mm · 6 of 23 slices shown (1 of 2)]
[im 1/23]
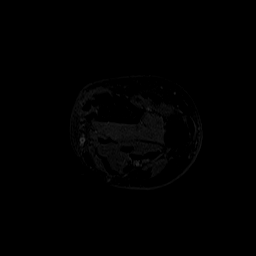
[im 5/23]
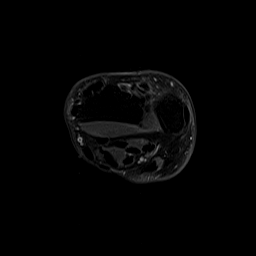
[im 9/23]
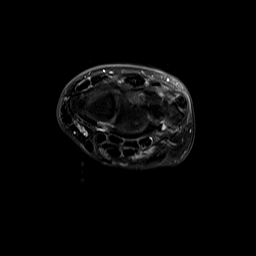
[im 14/23]
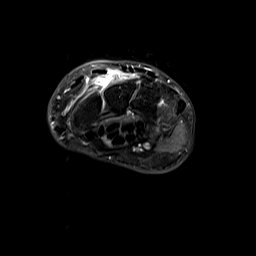
[im 18/23]
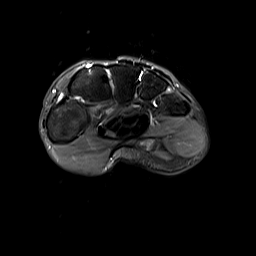
[im 23/23]
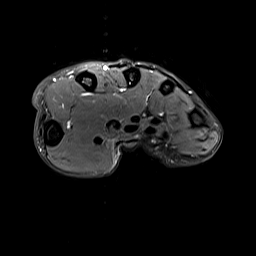

[Series 6: T2 fat-sat · coronal · 3.0mm · 0.39mm/px · 1 of 17 slices shown (2 of 2)]
[im 1/17]
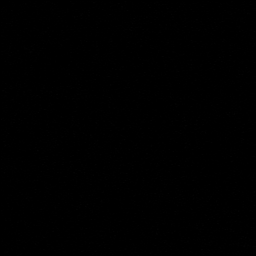

[Series 7: PD fat-sat · coronal · 3.0mm · 0.20mm/px · 5 of 17 slices shown (1 of 3)]
[im 1/17]
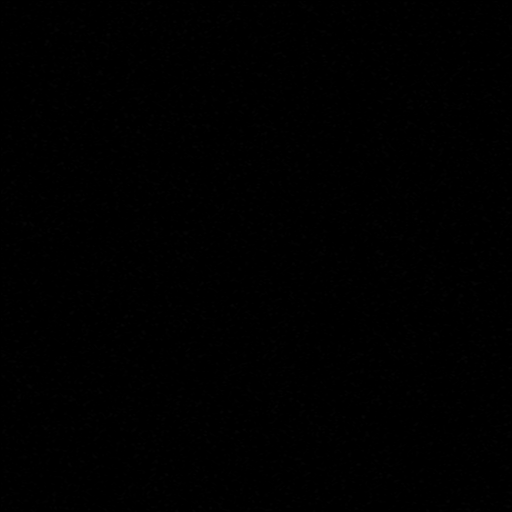
[im 5/17]
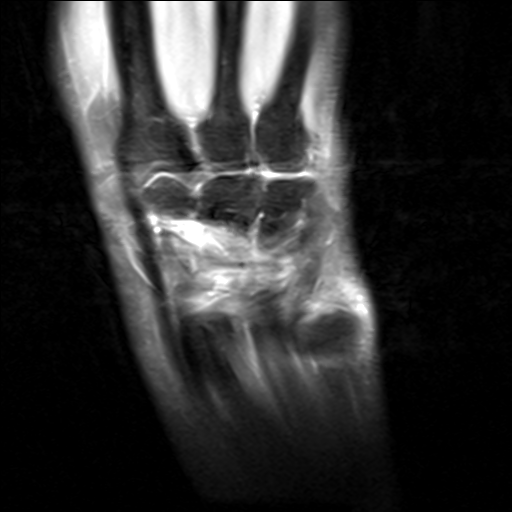
[im 9/17]
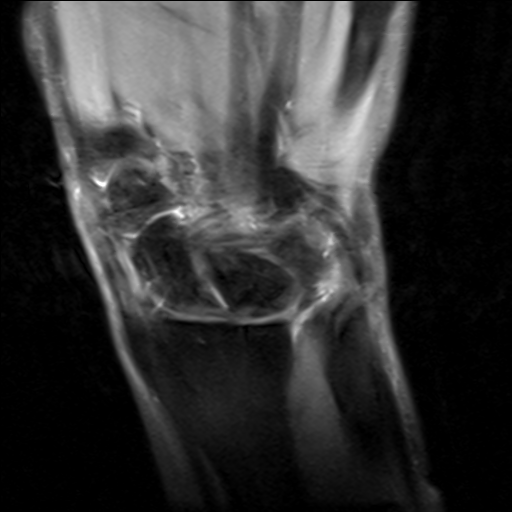
[im 13/17]
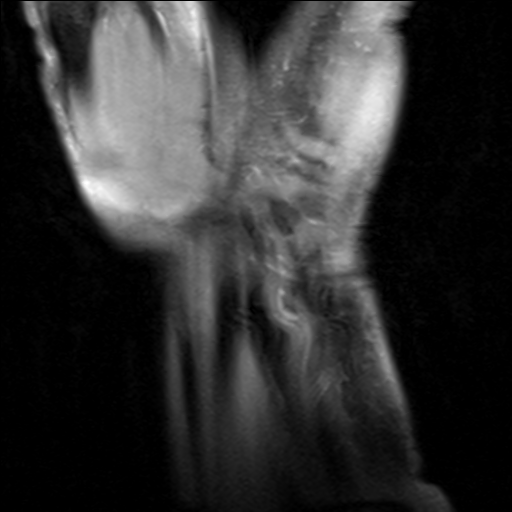
[im 17/17]
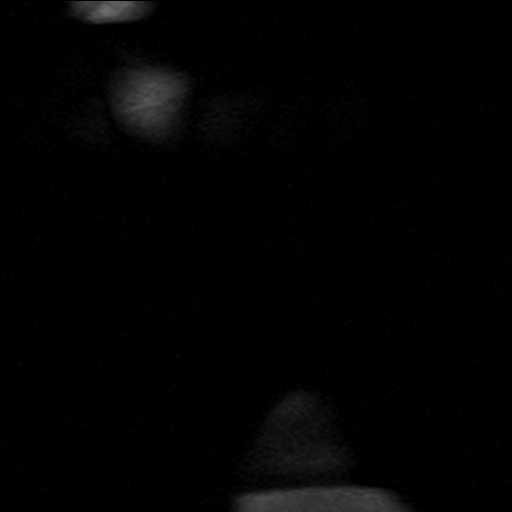

[Series 8: PD fat-sat · sagittal · 3.0mm · 0.18mm/px · 7 of 23 slices shown (2 of 3)]
[im 1/23]
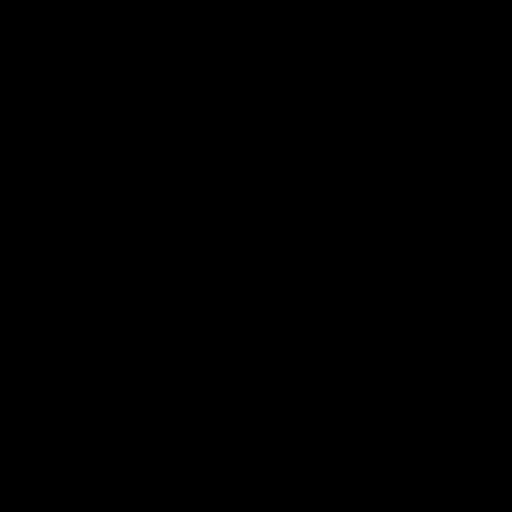
[im 4/23]
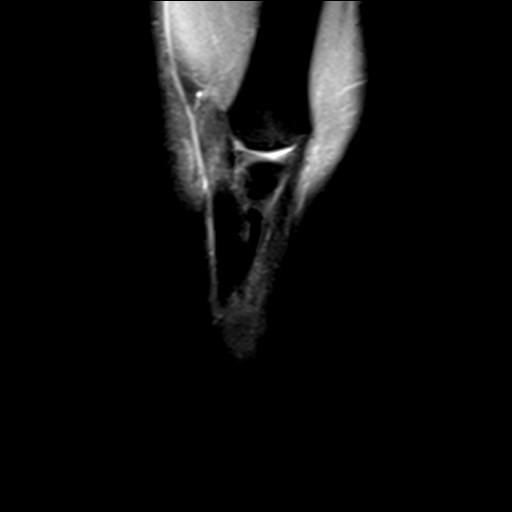
[im 8/23]
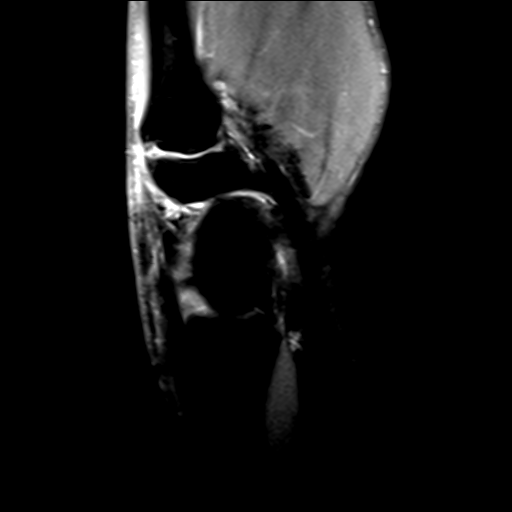
[im 12/23]
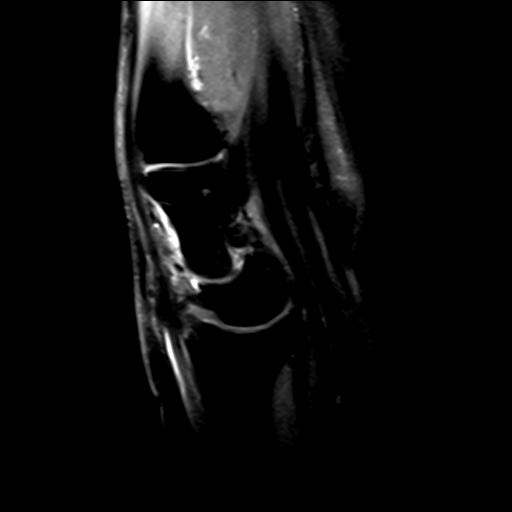
[im 15/23]
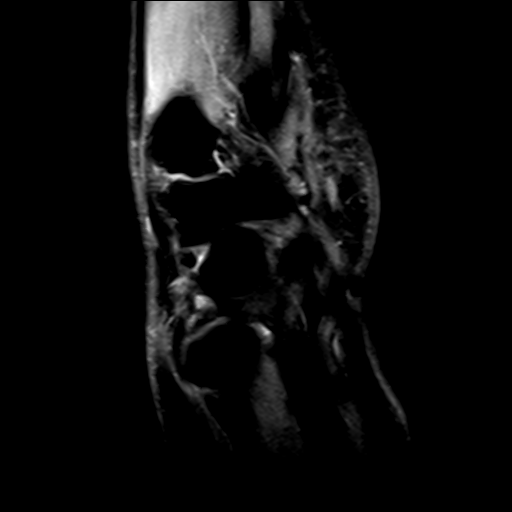
[im 19/23]
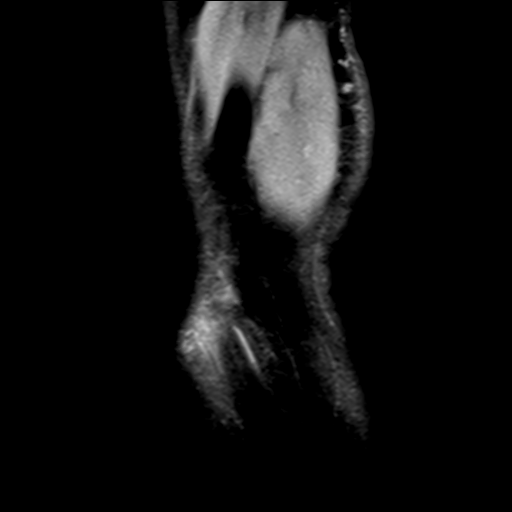
[im 23/23]
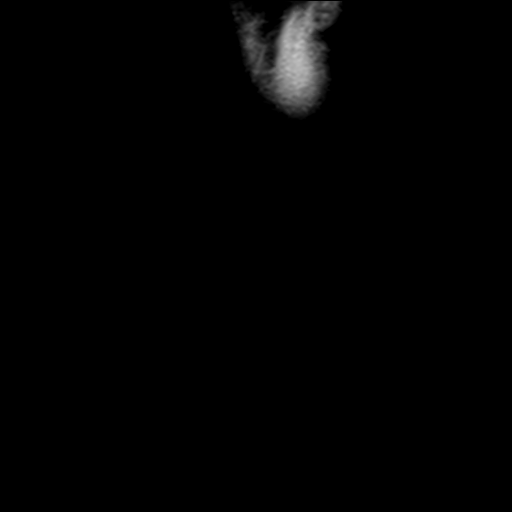

[Series 9: PD fat-sat · coronal · 3.0mm · 0.20mm/px · 5 of 17 slices shown (3 of 3)]
[im 1/17]
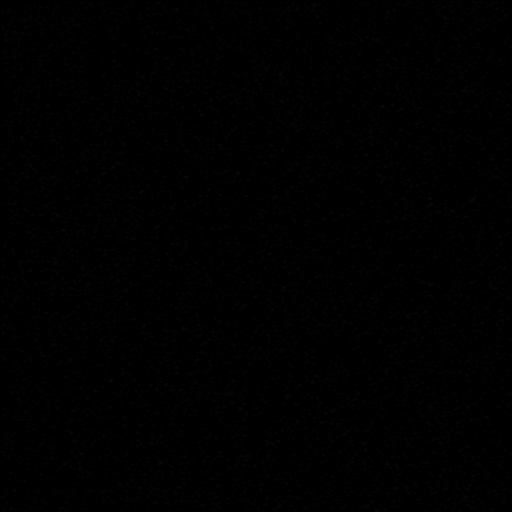
[im 5/17]
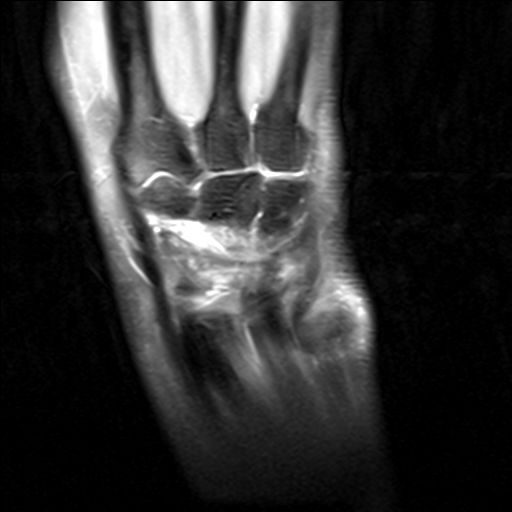
[im 9/17]
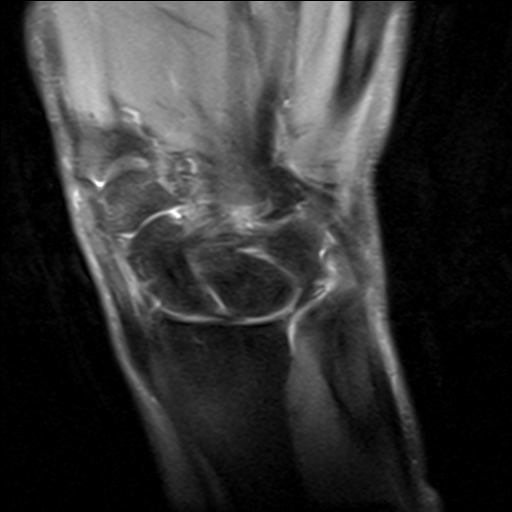
[im 13/17]
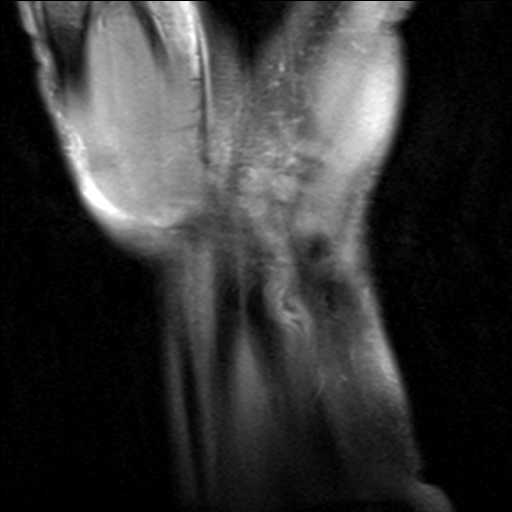
[im 17/17]
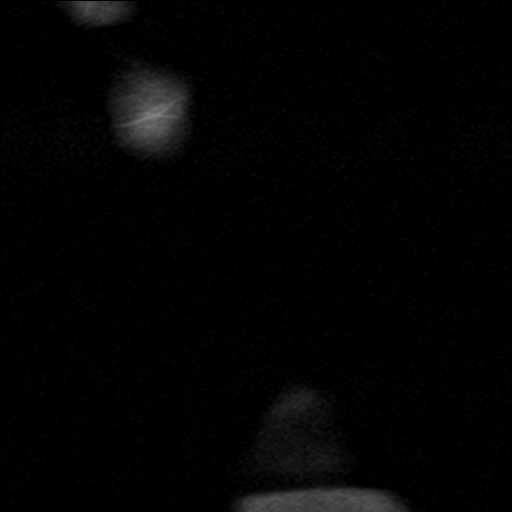

[24 of 40 positions shown; findings below may reference images not displayed]

FINDINGS: Ligaments: Extrinsic ligaments are intact. Scapholunate and
lunotriquetral ligaments are intact.

Triangular fibrocartilage: Intact

Tendons: There is edema around the second and third extensor
compartments distal to the lister's tubercle with thickening of the
tendons of the second compartment consistent with tendinopathy with
tenosynovitis.

There is intermediate signal and thickening of the extensor carpi
ulnaris consistent with mild tendinopathy.

Flexor compartment tendons are intact without tendinosis, tear or
tenosynovitis.

Carpal tunnel/median nerve: Flexor retinaculum is intact. Normal
carpal tunnel without a mass. Median nerve demonstrates normal
signal and caliber.

Guyon's canal: Normal Guyon's canal. Normal ulnar nerve.

Joint/cartilage: No chondral defect. No joint effusion.

Bones/carpal alignment: No fracture, avascular necrosis, or osseous
lesion. Normal alignment.

Other: Muscles are normal. No fluid collection, hematoma, or soft
tissue mass.
IMPRESSION: 1. Peritendinous edema and thickening of the second and third
extensor compartment tendons distal to the Lister's tubercle
suspicious for distal intersection syndrome.

2.  Mild tendinopathy of the extensor carpi ulnaris.

3.  No evidence of fracture or dislocation.

4. Scapholunate, lunotriquetral ligaments and triangular
fibrocartilage appear intact.

## 2023-09-01 ENCOUNTER — Encounter: Payer: Self-pay | Admitting: *Deleted

## 2023-09-11 ENCOUNTER — Encounter: Payer: Self-pay | Admitting: *Deleted

## 2023-09-18 ENCOUNTER — Ambulatory Visit: Payer: 59 | Admitting: Certified Registered"

## 2023-09-18 ENCOUNTER — Encounter: Payer: Self-pay | Admitting: *Deleted

## 2023-09-18 ENCOUNTER — Encounter
Admission: RE | Disposition: A | Discharge status: Home / Self Care | Payer: Self-pay | Source: Home / Self Care | Attending: Gastroenterology

## 2023-09-18 ENCOUNTER — Ambulatory Visit
Admission: RE | Admit: 2023-09-18 | Discharge: 2023-09-18 | Disposition: A | Discharge status: Home / Self Care | Payer: 59 | Attending: Gastroenterology | Admitting: Gastroenterology

## 2023-09-18 DIAGNOSIS — Z7984 Long term (current) use of oral hypoglycemic drugs: Secondary | ICD-10-CM | POA: Diagnosis not present

## 2023-09-18 DIAGNOSIS — J45909 Unspecified asthma, uncomplicated: Secondary | ICD-10-CM | POA: Insufficient documentation

## 2023-09-18 DIAGNOSIS — Z1211 Encounter for screening for malignant neoplasm of colon: Secondary | ICD-10-CM | POA: Insufficient documentation

## 2023-09-18 DIAGNOSIS — E039 Hypothyroidism, unspecified: Secondary | ICD-10-CM | POA: Diagnosis not present

## 2023-09-18 DIAGNOSIS — K219 Gastro-esophageal reflux disease without esophagitis: Secondary | ICD-10-CM | POA: Diagnosis not present

## 2023-09-18 DIAGNOSIS — E119 Type 2 diabetes mellitus without complications: Secondary | ICD-10-CM | POA: Diagnosis not present

## 2023-09-18 DIAGNOSIS — Z79890 Hormone replacement therapy (postmenopausal): Secondary | ICD-10-CM | POA: Insufficient documentation

## 2023-09-18 DIAGNOSIS — Z87891 Personal history of nicotine dependence: Secondary | ICD-10-CM | POA: Diagnosis not present

## 2023-09-18 HISTORY — DX: Pain in unspecified joint: M25.50

## 2023-09-18 HISTORY — PX: COLONOSCOPY WITH PROPOFOL: SHX5780

## 2023-09-18 HISTORY — DX: Seborrheic dermatitis, unspecified: L21.9

## 2023-09-18 HISTORY — DX: Other specified postprocedural states: Z98.890

## 2023-09-18 HISTORY — DX: Preglaucoma, unspecified, left eye: H40.002

## 2023-09-18 HISTORY — DX: Personal history of other medical treatment: Z92.89

## 2023-09-18 HISTORY — DX: Abnormal uterine and vaginal bleeding, unspecified: N93.9

## 2023-09-18 HISTORY — DX: Gestational diabetes mellitus in pregnancy, unspecified control: O24.419

## 2023-09-18 HISTORY — DX: History of uterine scar from previous surgery: Z98.891

## 2023-09-18 HISTORY — DX: Vitamin D deficiency, unspecified: E55.9

## 2023-09-18 HISTORY — DX: Unspecified primary angle-closure glaucoma, stage unspecified: H40.20X0

## 2023-09-18 HISTORY — DX: Asymptomatic varicose veins of bilateral lower extremities: I83.93

## 2023-09-18 LAB — POCT PREGNANCY, URINE: Preg Test, Ur: NEGATIVE

## 2023-09-18 SURGERY — COLONOSCOPY WITH PROPOFOL
Anesthesia: General

## 2023-09-18 MED ORDER — SODIUM CHLORIDE 0.9 % IV SOLN
INTRAVENOUS | Status: DC
  Administered 2023-09-18: 20 mL/h via INTRAVENOUS

## 2023-09-18 MED ORDER — PROPOFOL 10 MG/ML IV BOLUS
INTRAVENOUS | Status: DC | PRN
  Administered 2023-09-18: 100 mg via INTRAVENOUS
  Administered 2023-09-18: 30 mg via INTRAVENOUS

## 2023-09-18 MED ORDER — LIDOCAINE HCL (CARDIAC) PF 100 MG/5ML IV SOSY
PREFILLED_SYRINGE | INTRAVENOUS | Status: DC | PRN
  Administered 2023-09-18: 80 mg via INTRAVENOUS

## 2023-09-18 MED ORDER — PROPOFOL 500 MG/50ML IV EMUL
INTRAVENOUS | Status: DC | PRN
  Administered 2023-09-18: 125 ug/kg/min via INTRAVENOUS

## 2023-09-18 NOTE — Interval H&P Note (Signed)
History and Physical Interval Note:  09/18/2023 9:24 AM  Tonya Woodward  has presented today for surgery, with the diagnosis of Z12.11 (ICD-10-CM) - Colon cancer screening.  The various methods of treatment have been discussed with the patient and family. After consideration of risks, benefits and other options for treatment, the patient has consented to  Procedure(s): COLONOSCOPY WITH PROPOFOL (N/A) as a surgical intervention.  The patient's history has been reviewed, patient examined, no change in status, stable for surgery.  I have reviewed the patient's chart and labs.  Questions were answered to the patient's satisfaction.     Regis Bill  Ok to proceed with colonoscopy

## 2023-09-18 NOTE — Transfer of Care (Signed)
Immediate Anesthesia Transfer of Care Note  Patient: Tonya Woodward Jun  Procedure(s) Performed: COLONOSCOPY WITH PROPOFOL  Patient Location: PACU  Anesthesia Type:General  Level of Consciousness: awake and alert   Airway & Oxygen Therapy: Patient Spontanous Breathing  Post-op Assessment: Report given to RN and Post -op Vital signs reviewed and stable  Post vital signs: Reviewed and stable  Last Vitals: Ssee PACU flow sheet for normal V/S    Pt awake laughing and joking with staff.  Vitals Value Taken Time  BP    Temp    Pulse    Resp    SpO2      Last Pain:  Vitals:   09/18/23 0842  TempSrc: Temporal  PainSc: 0-No pain         Complications: No notable events documented.

## 2023-09-18 NOTE — H&P (Signed)
Outpatient short stay form Pre-procedure 09/18/2023  Regis Bill, MD  Primary Physician: Ardyth Man, PA-C  Reason for visit:  Screening  History of present illness:    47 y/o lady with history of hypothyroidism here for screening colonoscopy. No blood thinners. No family history of GI malignancies. No significant abdominal surgeries.    Current Facility-Administered Medications:    0.9 %  sodium chloride infusion, , Intravenous, Continuous, Lieutenant Abarca, Rossie Muskrat, MD, Last Rate: 20 mL/hr at 09/18/23 0852, 20 mL/hr at 09/18/23 0852  Medications Prior to Admission  Medication Sig Dispense Refill Last Dose   acidophilus (RISAQUAD) CAPS capsule Take 1 capsule by mouth daily.   Past Week   albuterol (VENTOLIN HFA) 108 (90 Base) MCG/ACT inhaler Inhale 2 puffs into the lungs every 4 (four) hours as needed. 18 g 0 Past Week   clobetasol cream (TEMOVATE) 0.05 % Apply 1 Application topically 2 (two) times daily.   Past Week   fluticasone (FLONASE) 50 MCG/ACT nasal spray Place 2 sprays into both nostrils daily.   Past Week   lidocaine (XYLOCAINE) 2 % solution Use as directed 15 mLs in the mouth or throat every 3 (three) hours as needed for mouth pain (swish and spit). 100 mL 0 Past Week   Spacer/Aero-Holding Chambers (AEROCHAMBER MV) inhaler Use as instructed 1 each 2 Past Week   SYNTHROID 100 MCG tablet Take 100 mcg by mouth daily.   09/17/2023   Varenicline Tartrate (TYRVAYA) 0.03 MG/ACT SOLN Place 1 spray into the nose 2 (two) times daily.   Past Week   Vitamin D, Ergocalciferol, (DRISDOL) 1.25 MG (50000 UNIT) CAPS capsule Take 1 capsule by mouth once a week.   Past Week   Multiple Vitamins-Minerals (MULTIVITAMIN WITH MINERALS) tablet Take 1 tablet by mouth daily.        Allergies  Allergen Reactions   Tape Other (See Comments)    Bandaids - mild reaction- redness     Past Medical History:  Diagnosis Date   Abnormal uterine bleeding (AUB)    Borderline glaucoma of left eye     GERD (gastroesophageal reflux disease)    H/O cesarean section    Heart murmur    History of colposcopy    History of dental surgery    Hypothyroidism    Narrow angle glaucoma of right eye    Polyarthralgia    Seborrheic dermatitis of scalp    Varicose veins of both lower extremities    Vitamin D deficiency     Review of systems:  Otherwise negative.    Physical Exam  Gen: Alert, oriented. Appears stated age.  HEENT: PERRLA. Lungs: No respiratory distress CV: RRR Abd: soft, benign, no masses Ext: No edema    Planned procedures: Proceed with EGD/colonoscopy. The patient understands the nature of the planned procedure, indications, risks, alternatives and potential complications including but not limited to bleeding, infection, perforation, damage to internal organs and possible oversedation/side effects from anesthesia. The patient agrees and gives consent to proceed.  Please refer to procedure notes for findings, recommendations and patient disposition/instructions.     Regis Bill, MD [facility] Gastroenter[facility]

## 2023-09-18 NOTE — Op Note (Signed)
[facility] Gastroenterology Patient Name: Tonya Woodward Procedure Date: 09/18/2023 9:21 AM MRN: [id number] Account #: [id number] Date of Birth: 1976-10-06 Admit Type: Outpatient Age: 47 Room: [facility] ENDO ROOM 3 Gender: Female Note Status: Finalized Instrument Name: Nelda Marseille [id number] Procedure:             Colonoscopy Indications:           Screening for colorectal malignant neoplasm Providers:             Eather Colas MD, MD Referring MD:          Ardyth Man PA Medicines:             Monitored Anesthesia Care Complications:         No immediate complications. Procedure:             Pre-Anesthesia Assessment:                        - Prior to the procedure, a History and Physical was                         performed, and patient medications and allergies were                         reviewed. The patient is competent. The risks and                         benefits of the procedure and the sedation options and                         risks were discussed with the patient. All questions                         were answered and informed consent was obtained.                         Patient identification and proposed procedure were                         verified by the physician, the nurse, the                         anesthesiologist, the anesthetist and the technician                         in the endoscopy suite. Mental Status Examination:                         alert and oriented. Airway Examination: normal                         oropharyngeal airway and neck mobility. Respiratory                         Examination: clear to auscultation. CV Examination:                         normal. Prophylactic Antibiotics: The patient does not  require prophylactic antibiotics. Prior                         Anticoagulants: The patient has taken no anticoagulant                         or antiplatelet agents. ASA Grade Assessment: II  - A                         patient with mild systemic disease. After reviewing                         the risks and benefits, the patient was deemed in                         satisfactory condition to undergo the procedure. The                         anesthesia plan was to use monitored anesthesia care                         (MAC). Immediately prior to administration of                         medications, the patient was re-assessed for adequacy                         to receive sedatives. The heart rate, respiratory                         rate, oxygen saturations, blood pressure, adequacy of                         pulmonary ventilation, and response to care were                         monitored throughout the procedure. The physical                         status of the patient was re-assessed after the                         procedure.                        After obtaining informed consent, the colonoscope was                         passed under direct vision. Throughout the procedure,                         the patient's blood pressure, pulse, and oxygen                         saturations were monitored continuously. The                         Colonoscope was introduced through the anus and  advanced to the the terminal ileum. The colonoscopy                         was performed without difficulty. The patient                         tolerated the procedure well. The quality of the bowel                         preparation was excellent. The terminal ileum,                         ileocecal valve, appendiceal orifice, and rectum were                         photographed. Findings:      The perianal and digital rectal examinations were normal.      The terminal ileum appeared normal.      The entire examined colon appeared normal on direct and retroflexion       views. Impression:            - The examined portion of the ileum was normal.                         - The entire examined colon is normal on direct and                         retroflexion views.                        - No specimens collected. Recommendation:        - Discharge patient to home.                        - Resume previous diet.                        - Continue present medications.                        - Repeat colonoscopy in 10 years for screening                         purposes.                        - Return to referring physician as previously                         scheduled. Procedure Code(s):     --- Professional ---                        I3474, Colorectal cancer screening; colonoscopy on                         individual not meeting criteria for high risk Diagnosis Code(s):     --- Professional ---                        Z12.11, Encounter for screening for malignant neoplasm  of colon CPT copyright 2022 [employer]. All rights reserved. The codes documented in this report are preliminary and upon coder review may  be revised to meet current compliance requirements. Eather Colas MD, MD 09/18/2023 9:50:26 AM Number of Addenda: 0 Note Initiated On: 09/18/2023 9:21 AM Scope Withdrawal Time: 0 hours 8 minutes 29 seconds  Total Procedure Duration: 0 hours 15 minutes 22 seconds  Estimated Blood Loss:  Estimated blood loss: none.      [facility]

## 2023-09-18 NOTE — Anesthesia Postprocedure Evaluation (Signed)
Anesthesia Post Note  Patient: Honestie Souva Cozine  Procedure(s) Performed: COLONOSCOPY WITH PROPOFOL  Patient location during evaluation: Endoscopy Anesthesia Type: General Level of consciousness: awake and alert Pain management: pain level controlled Vital Signs Assessment: post-procedure vital signs reviewed and stable Respiratory status: spontaneous breathing, nonlabored ventilation, respiratory function stable and patient connected to nasal cannula oxygen Cardiovascular status: blood pressure returned to baseline and stable Postop Assessment: no apparent nausea or vomiting Anesthetic complications: no  No notable events documented.   Last Vitals:  Vitals:   09/18/23 1002 09/18/23 1013  BP:    Pulse: (!) 56 (!) 52  Resp:    Temp:    SpO2: 100% 100%    Last Pain:  Vitals:   09/18/23 1013  TempSrc:   PainSc: 0-No pain                 Stephanie Coup

## 2023-09-18 NOTE — Anesthesia Preprocedure Evaluation (Signed)
Anesthesia Evaluation  Patient identified by MRN, date of birth, ID band Patient awake    Reviewed: Allergy & Precautions, NPO status , Patient's Chart, lab work & pertinent test results  Airway Mallampati: II  TM Distance: >3 FB Neck ROM: full    Dental no notable dental hx. (+) Chipped, Dental Advidsory Given   Pulmonary neg pulmonary ROS, asthma , former smoker   Pulmonary exam normal        Cardiovascular negative cardio ROS Normal cardiovascular exam+ Valvular Problems/Murmurs      Neuro/Psych negative neurological ROS  negative psych ROS   GI/Hepatic negative GI ROS, Neg liver ROS,GERD  Medicated and Controlled,,  Endo/Other  negative endocrine ROSdiabetes, Well Controlled, Type 2, Oral Hypoglycemic AgentsHypothyroidism    Renal/GU negative Renal ROS  negative genitourinary   Musculoskeletal   Abdominal   Peds  Hematology negative hematology ROS (+)   Anesthesia Other Findings Past Medical History: No date: Abnormal uterine bleeding (AUB) No date: Borderline glaucoma of left eye No date: GERD (gastroesophageal reflux disease) No date: H/O cesarean section No date: Heart murmur No date: History of colposcopy No date: History of dental surgery No date: Hypothyroidism No date: Narrow angle glaucoma of right eye No date: Polyarthralgia No date: Seborrheic dermatitis of scalp No date: Varicose veins of both lower extremities No date: Vitamin D deficiency  Past Surgical History: 06/07/2016: CESAREAN SECTION; N/A     Comment:  Procedure: CESAREAN SECTION;  Surgeon: Suzy Bouchard, MD;  Location: [facility] ORS;  Service:               Obstetrics;  Laterality: N/A; No date: DIAGNOSTIC LAPAROSCOPY  BMI    Body Mass Index: 25.18 kg/m      Reproductive/Obstetrics negative OB ROS                             Anesthesia Physical Anesthesia Plan  ASA: 2  Anesthesia  Plan: General   Post-op Pain Management: Minimal or no pain anticipated   Induction: Intravenous  PONV Risk Score and Plan: 3 and Propofol infusion, TIVA and Ondansetron  Airway Management Planned: Nasal Cannula  Additional Equipment: None  Intra-op Plan:   Post-operative Plan:   Informed Consent: I have reviewed the patients History and Physical, chart, labs and discussed the procedure including the risks, benefits and alternatives for the proposed anesthesia with the patient or authorized representative who has indicated his/her understanding and acceptance.     Dental advisory given  Plan Discussed with: CRNA and Surgeon  Anesthesia Plan Comments: (Discussed risks of anesthesia with patient, including possibility of difficulty with spontaneous ventilation under anesthesia necessitating airway intervention, PONV, and rare risks such as cardiac or respiratory or neurological events, and allergic reactions. Discussed the role of CRNA in patient's perioperative care. Patient understands.)       Anesthesia Quick Evaluation

## 2023-09-19 ENCOUNTER — Encounter: Payer: Self-pay | Admitting: Gastroenterology

## 2024-01-31 ENCOUNTER — Ambulatory Visit
Admission: EM | Admit: 2024-01-31 | Discharge: 2024-01-31 | Disposition: A | Discharge status: Home / Self Care | Attending: Family Medicine | Admitting: Family Medicine

## 2024-01-31 DIAGNOSIS — R42 Dizziness and giddiness: Secondary | ICD-10-CM | POA: Insufficient documentation

## 2024-01-31 DIAGNOSIS — R55 Syncope and collapse: Secondary | ICD-10-CM | POA: Insufficient documentation

## 2024-01-31 LAB — CBC WITH DIFFERENTIAL/PLATELET
Abs Immature Granulocytes: 0 10*3/uL (ref 0.00–0.07)
Basophils Absolute: 0.1 10*3/uL (ref 0.0–0.1)
Basophils Relative: 1 %
Eosinophils Absolute: 0.2 10*3/uL (ref 0.0–0.5)
Eosinophils Relative: 3 %
HCT: 40.2 % (ref 36.0–46.0)
Hemoglobin: 13.5 g/dL (ref 12.0–15.0)
Immature Granulocytes: 0 %
Lymphocytes Relative: 24 %
Lymphs Abs: 1.4 10*3/uL (ref 0.7–4.0)
MCH: 30 pg (ref 26.0–34.0)
MCHC: 33.6 g/dL (ref 30.0–36.0)
MCV: 89.3 fL (ref 80.0–100.0)
Monocytes Absolute: 0.4 10*3/uL (ref 0.1–1.0)
Monocytes Relative: 7 %
Neutro Abs: 3.8 10*3/uL (ref 1.7–7.7)
Neutrophils Relative %: 65 %
Platelets: 189 10*3/uL (ref 150–400)
RBC: 4.5 MIL/uL (ref 3.87–5.11)
RDW: 12.9 % (ref 11.5–15.5)
WBC: 5.9 10*3/uL (ref 4.0–10.5)
nRBC: 0 % (ref 0.0–0.2)

## 2024-01-31 LAB — COMPREHENSIVE METABOLIC PANEL WITH GFR
ALT: 16 U/L (ref 0–44)
AST: 25 U/L (ref 15–41)
Albumin: 4.6 g/dL (ref 3.5–5.0)
Alkaline Phosphatase: 48 U/L (ref 38–126)
Anion gap: 9 (ref 5–15)
BUN: 25 mg/dL — ABNORMAL HIGH (ref 6–20)
CO2: 23 mmol/L (ref 22–32)
Calcium: 9.2 mg/dL (ref 8.9–10.3)
Chloride: 105 mmol/L (ref 98–111)
Creatinine, Ser: 0.72 mg/dL (ref 0.44–1.00)
GFR, Estimated: >60 mL/min (ref >60)
Glucose, Bld: 95 mg/dL (ref 70–99)
Potassium: 4.2 mmol/L (ref 3.5–5.1)
Sodium: 137 mmol/L (ref 135–145)
Total Bilirubin: 1.1 mg/dL (ref 0.0–1.2)
Total Protein: 7.8 g/dL (ref 6.5–8.1)

## 2024-01-31 LAB — TSH: TSH: 1.983 u[IU]/mL (ref 0.350–4.500)

## 2024-01-31 LAB — PREGNANCY, URINE: Preg Test, Ur: NEGATIVE

## 2024-01-31 MED ORDER — MECLIZINE HCL 25 MG PO TABS: 25.0000 mg | ORAL_TABLET | Freq: Three times a day (TID) | ORAL | Status: AC | PRN

## 2024-01-31 NOTE — ED Provider Notes (Signed)
 [facility]    CSN: [id number] Arrival date & time: 01/31/24  5621      History   Chief Complaint Chief Complaint  Patient presents with   Dizziness    HPI Tonya Woodward is a 48 y.o. female.   HPI  Tonya Woodward presents for intermittent dizziness that startedTuesday while cooking dinner.  She felt like she was going to pass out.  She was church last night and got woozy and lightheaded again. Took some sinus medication as she thought it was allergy issues. Woke up this morning and felt like she "was swaying on a boat."    She works out every morning and can fell similar if she hasn't eaten.  Drinks a lot of water.  Drinks gatorade or powerade after working out.   Feels tired and weak like she needs a nap. Has slight intermittent headache. No neck pain, confusion, blurry vision, chest pain, shortness of breath. Has   She started her period earlier than normal. She was not due until the 22nd.      Past Medical History:  Diagnosis Date   Abnormal uterine bleeding (AUB)    Borderline glaucoma of left eye    GERD (gastroesophageal reflux disease)    H/O cesarean section    Heart murmur    History of colposcopy    History of dental surgery    Hypothyroidism    Narrow angle glaucoma of right eye    Polyarthralgia    Seborrheic dermatitis of scalp    Varicose veins of both lower extremities    Vitamin D deficiency     Patient Active Problem List   Diagnosis Date Noted   Post-operative state 06/07/2016    Past Surgical History:  Procedure Laterality Date   CESAREAN SECTION N/A 06/07/2016   Procedure: CESAREAN SECTION;  Surgeon: Carolynn Citrin, MD;  Location: [facility] ORS;  Service: Obstetrics;  Laterality: N/A;   COLONOSCOPY WITH PROPOFOL N/A 09/18/2023   Procedure: COLONOSCOPY WITH PROPOFOL;  Surgeon: Shane Darling, MD;  Location: [facility] ENDOSCOPY;  Service: Endoscopy;  Laterality: N/A;   DIAGNOSTIC LAPAROSCOPY      OB History     Gravida  2    Para  1   Term  1   Preterm  0   AB  1   Living  1      SAB  1   IAB  0   Ectopic  0   Multiple  0   Live Births  1            Home Medications    Prior to Admission medications   Medication Sig Start Date End Date Taking? Authorizing Provider  meclizine (ANTIVERT) 25 MG tablet Take 1 tablet (25 mg total) by mouth 3 (three) times daily as needed for dizziness. 01/31/24  Yes Ellenor Wisniewski, DO  Multiple Vitamins-Minerals (MULTIVITAMIN WITH MINERALS) tablet Take 1 tablet by mouth daily.   Yes [provider]  SYNTHROID 100 MCG tablet Take 100 mcg by mouth daily. 11/16/20  Yes [provider]  Vitamin D, Ergocalciferol, (DRISDOL) 1.25 MG (50000 UNIT) CAPS capsule Take 1 capsule by mouth once a week. 02/05/21  Yes [provider]  acidophilus (RISAQUAD) CAPS capsule Take 1 capsule by mouth daily.    [provider]  albuterol (VENTOLIN HFA) 108 (90 Base) MCG/ACT inhaler Inhale 2 puffs into the lungs every 4 (four) hours as needed. 03/03/21   Kent Pear, NP  clobetasol cream (TEMOVATE) 0.05 %  Apply 1 Application topically 2 (two) times daily.    [provider]  fluticasone (FLONASE) 50 MCG/ACT nasal spray Place 2 sprays into both nostrils daily.    [provider]  lidocaine (XYLOCAINE) 2 % solution Use as directed 15 mLs in the mouth or throat every 3 (three) hours as needed for mouth pain (swish and spit). 04/10/23   Floydene Hy, PA-C  Spacer/Aero-Holding Chambers (AEROCHAMBER MV) inhaler Use as instructed 03/03/21   Kent Pear, NP  Varenicline Tartrate (TYRVAYA) 0.03 MG/ACT SOLN Place 1 spray into the nose 2 (two) times daily.    [provider]  ferrous sulfate 325 (65 FE) MG tablet Take 1 tablet (325 mg total) by mouth 2 (two) times daily with a meal. For anemia, take with Vitamin C 06/09/16 03/03/21  Prescilla Brod, MD  omeprazole (PRILOSEC) 40 MG capsule Take 40 mg by mouth as needed.  03/03/21  [provider]    Family History History reviewed. No pertinent family history.  Social History Social History   Tobacco Use   Smoking status: Former    Current packs/day: 0.25    Types: Cigarettes   Smokeless tobacco: Never  Vaping Use   Vaping status: Never Used  Substance Use Topics   Alcohol use: Yes   Drug use: No     Allergies   Tape   Review of Systems Review of Systems: negative unless otherwise stated in HPI.      Physical Exam Triage Vital Signs ED Triage Vitals  Encounter Vitals Group     BP      Systolic BP Percentile      Diastolic BP Percentile      Pulse      Resp      Temp      Temp src      SpO2      Weight      Height      Head Circumference      Peak Flow      Pain Score      Pain Loc      Pain Education      Exclude from Growth Chart    No data found.  Updated Vital Signs BP 130/82 (BP Location: Left Arm)   Pulse 70   Temp 98.1 F (36.7 C) (Oral)   Resp 16   LMP 01/30/2024 (Exact Date)   SpO2 99%   Visual Acuity Right Eye Distance:   Left Eye Distance:   Bilateral Distance:    Right Eye Near:   Left Eye Near:    Bilateral Near:     Physical Exam GEN:     alert, well appearing female and no distress    HENT:  mucus membranes moist, oropharyngeal without lesions or erythema ,  nares patent, no nasal discharge, normal TM bilaterally  EYES:   pupils equal and reactive to light and accomodation, EOM intact, no nystagmus  NECK:  supple, normal ROM RESP:  clear to auscultation bilaterally, no increased work of breathing  CVS:   regular rate and rhythm NEURO:  alert, oriented, speech normal, CN 2-12 grossly intact, no facial droop,  sensation grossly intact, strength 5/5 bilateral UE and LE, normal coordination, positive Romberg  Skin:   warm and dry      UC Treatments / Results  Labs (all labs ordered are listed, but only abnormal results are displayed) Labs Reviewed  COMPREHENSIVE METABOLIC PANEL WITH GFR -  Abnormal; Notable for the following  components:      Result Value   BUN 25 (*)    All other components within normal limits  PREGNANCY, URINE  CBC WITH DIFFERENTIAL/PLATELET  TSH    EKG  If EKG performed, see my interpretation in the MDM section  Radiology No results found.   Procedures Procedures (including critical care time)  Medications Ordered in UC Medications - No data to display  Initial Impression / Assessment and Plan / UC Course  I have reviewed the triage vital signs and the nursing notes.  Pertinent labs & imaging results that were available during my care of the patient were reviewed by me and considered in my medical decision making (see chart for details).       Pt is a 48 y.o. female with history of hypothyroidism, who presents for room spinning dizziness for the past 3 days.  She does not have a headache. There has been no syncope.  I doubt acute cerebellar CVA. History not consistent with complex migraine. She does not have upper respiratory symptoms to suggest labyrinthitis. No evidence of otitis media or effusion. No hearing loss to suggest Mnire's disease. No foreign bodies seen. Recent TSH from May 2024 was normal.  No head imaging to review. On exam, she has signs of BPPV as symptoms elicited by head movement.   Obtain TSH, urine pregnancy, CBC and CMP.  EKG personally interpreted by me; NSR without acute ST or T wave changes; no prior EKG available for review.  CBC and CMP unremarkable.  Urine pregnancy test was negative.  TSH is normal.  Treat with meclizine patient prefers to get this over-the-counter and sent a prescription.  Showed patient how to perform Epley maneuver at home.  Handout also given.  She may benefit from vestibular rehab.  ED precautions reviewed and patient voiced understanding. Pt may need vestibular rehab, if symptoms not improving.    Final Clinical Impressions(s) / UC Diagnoses   Final diagnoses:  Vertigo  Near syncope      Discharge Instructions      You are not pregnant. You are not anemic nor do you have elevated blood levels for inflammation or infection.  Your kidney and liver function tests are normal. Your eletrolytes are normal.    See handout on the Epley maneuver. You can see videos of this online. See handout out on benign positional peripheral vertigo (BPPV).  Try taking the Dramamine to see if this improves your symptoms. If you get light headed or dizzy, sit down first then crawl to your desired location in order to prevent you from passing out and hitting your head.   If symptoms are not improving, you may need vestibular rehab with physical therapy.  Speak with your primary care doctor about this.   If your symptoms worsen or accompanied by confusion, worsening headache or weakness, go to the hospital emergency department as these are symptoms of a stroke.      ED Prescriptions     Medication Sig Dispense Auth. Provider   meclizine (ANTIVERT) 25 MG tablet Take 1 tablet (25 mg total) by mouth 3 (three) times daily as needed for dizziness. -- Dajohn Ellender, DO      PDMP not reviewed this encounter.   Rukiya Hodgkins, DO 02/07/24 1434

## 2024-01-31 NOTE — ED Triage Notes (Signed)
 Patient states that she was cooking dinner 2 nights ago and started getting dizzy. Happened again last night and still feels dizzy this morning. Patient states that she feels like she's swimming.

## 2024-01-31 NOTE — Discharge Instructions (Addendum)
 You are not pregnant. You are not anemic nor do you have elevated blood levels for inflammation or infection.  Your kidney and liver function tests are normal. Your eletrolytes are normal.    See handout on the Epley maneuver. You can see videos of this online. See handout out on benign positional peripheral vertigo (BPPV).  Try taking the Dramamine to see if this improves your symptoms. If you get light headed or dizzy, sit down first then crawl to your desired location in order to prevent you from passing out and hitting your head.   If symptoms are not improving, you may need vestibular rehab with physical therapy.  Speak with your primary care doctor about this.   If your symptoms worsen or accompanied by confusion, worsening headache or weakness, go to the hospital emergency department as these are symptoms of a stroke.
